# Patient Record
Sex: Male | Born: 1945
Health system: Southern US, Community
[De-identification: ages and names within clinical notes are randomized; demographics above are authoritative.]

## PROBLEM LIST (undated history)

## (undated) DIAGNOSIS — E785 Hyperlipidemia, unspecified: Secondary | ICD-10-CM

## (undated) DIAGNOSIS — E119 Type 2 diabetes mellitus without complications: Secondary | ICD-10-CM

## (undated) DIAGNOSIS — N529 Male erectile dysfunction, unspecified: Secondary | ICD-10-CM

## (undated) DIAGNOSIS — F329 Major depressive disorder, single episode, unspecified: Secondary | ICD-10-CM

## (undated) DIAGNOSIS — Z8673 Personal history of transient ischemic attack (TIA), and cerebral infarction without residual deficits: Secondary | ICD-10-CM

## (undated) DIAGNOSIS — I639 Cerebral infarction, unspecified: Secondary | ICD-10-CM

## (undated) DIAGNOSIS — F32A Depression, unspecified: Secondary | ICD-10-CM

## (undated) HISTORY — DX: Depression, unspecified: F32.A

## (undated) HISTORY — DX: Major depressive disorder, single episode, unspecified: F32.9

## (undated) HISTORY — DX: Personal history of transient ischemic attack (TIA), and cerebral infarction without residual deficits: Z86.73

## (undated) HISTORY — DX: Type 2 diabetes mellitus without complications: E11.9

## (undated) HISTORY — DX: Male erectile dysfunction, unspecified: N52.9

## (undated) HISTORY — DX: Hyperlipidemia, unspecified: E78.5

---

## 1989-04-26 HISTORY — PX: COLONOSCOPY: SHX174

## 2004-07-15 ENCOUNTER — Encounter: Payer: Self-pay | Admitting: Emergency Medicine

## 2004-07-16 ENCOUNTER — Ambulatory Visit: Payer: Self-pay | Admitting: Physical Medicine & Rehabilitation

## 2004-07-16 ENCOUNTER — Inpatient Hospital Stay (HOSPITAL_COMMUNITY): Admission: EM | Admit: 2004-07-16 | Discharge: 2004-07-19 | Payer: Self-pay | Admitting: Neurology

## 2004-07-16 ENCOUNTER — Encounter (INDEPENDENT_AMBULATORY_CARE_PROVIDER_SITE_OTHER): Payer: Self-pay | Admitting: Cardiology

## 2004-07-31 ENCOUNTER — Encounter: Admission: RE | Admit: 2004-07-31 | Discharge: 2004-10-29 | Payer: Self-pay | Admitting: Neurology

## 2004-08-01 ENCOUNTER — Ambulatory Visit: Payer: Self-pay | Admitting: Internal Medicine

## 2004-08-15 ENCOUNTER — Encounter: Admission: RE | Admit: 2004-08-15 | Discharge: 2004-11-13 | Payer: Self-pay | Admitting: Internal Medicine

## 2004-11-08 ENCOUNTER — Ambulatory Visit: Payer: Self-pay | Admitting: Internal Medicine

## 2005-03-11 ENCOUNTER — Ambulatory Visit: Payer: Self-pay | Admitting: Internal Medicine

## 2005-05-13 ENCOUNTER — Ambulatory Visit: Payer: Self-pay | Admitting: Internal Medicine

## 2005-08-14 ENCOUNTER — Ambulatory Visit: Payer: Self-pay | Admitting: Internal Medicine

## 2005-12-09 ENCOUNTER — Ambulatory Visit: Payer: Self-pay | Admitting: Endocrinology

## 2005-12-16 ENCOUNTER — Ambulatory Visit: Payer: Self-pay | Admitting: Internal Medicine

## 2005-12-23 ENCOUNTER — Ambulatory Visit: Payer: Self-pay | Admitting: Internal Medicine

## 2006-03-06 ENCOUNTER — Ambulatory Visit: Payer: Self-pay | Admitting: Internal Medicine

## 2006-03-14 ENCOUNTER — Ambulatory Visit (HOSPITAL_COMMUNITY): Admission: RE | Admit: 2006-03-14 | Discharge: 2006-03-14 | Payer: Self-pay | Admitting: Neurology

## 2006-03-14 ENCOUNTER — Encounter (INDEPENDENT_AMBULATORY_CARE_PROVIDER_SITE_OTHER): Payer: Self-pay | Admitting: Neurology

## 2006-04-04 ENCOUNTER — Ambulatory Visit: Payer: Self-pay | Admitting: Internal Medicine

## 2006-07-07 ENCOUNTER — Ambulatory Visit: Payer: Self-pay | Admitting: Internal Medicine

## 2006-08-28 ENCOUNTER — Ambulatory Visit: Payer: Self-pay | Admitting: Internal Medicine

## 2006-10-05 ENCOUNTER — Emergency Department (HOSPITAL_COMMUNITY): Admission: EM | Admit: 2006-10-05 | Discharge: 2006-10-05 | Payer: Self-pay | Admitting: Emergency Medicine

## 2006-10-15 ENCOUNTER — Ambulatory Visit: Payer: Self-pay | Admitting: Internal Medicine

## 2006-12-16 ENCOUNTER — Ambulatory Visit: Payer: Self-pay | Admitting: Internal Medicine

## 2007-01-27 ENCOUNTER — Ambulatory Visit: Payer: Self-pay | Admitting: Internal Medicine

## 2007-03-18 DIAGNOSIS — Z8679 Personal history of other diseases of the circulatory system: Secondary | ICD-10-CM

## 2007-03-18 DIAGNOSIS — K279 Peptic ulcer, site unspecified, unspecified as acute or chronic, without hemorrhage or perforation: Secondary | ICD-10-CM | POA: Insufficient documentation

## 2007-05-05 ENCOUNTER — Ambulatory Visit: Payer: Self-pay | Admitting: Internal Medicine

## 2007-05-20 ENCOUNTER — Ambulatory Visit: Payer: Self-pay | Admitting: Internal Medicine

## 2007-05-20 LAB — CONVERTED CEMR LAB
ALT: 28 units/L (ref 0–53)
AST: 29 units/L (ref 0–37)
BUN: 6 mg/dL (ref 6–23)
CO2: 31 meq/L (ref 19–32)
Calcium: 9.3 mg/dL (ref 8.4–10.5)
Chloride: 108 meq/L (ref 96–112)
Cholesterol: 134 mg/dL (ref 0–200)
Creatinine, Ser: 1.2 mg/dL (ref 0.4–1.5)
Creatinine,U: 342.1 mg/dL
GFR calc Af Amer: 79 mL/min
GFR calc non Af Amer: 65 mL/min
Glucose, Bld: 115 mg/dL — ABNORMAL HIGH (ref 70–99)
HDL: 33 mg/dL — ABNORMAL LOW (ref >39.0)
Hgb A1c MFr Bld: 6.4 % — ABNORMAL HIGH (ref 4.6–6.0)
LDL Cholesterol: 83 mg/dL (ref 0–99)
Microalb Creat Ratio: 8.5 mg/g (ref 0.0–30.0)
Microalb, Ur: 2.9 mg/dL — ABNORMAL HIGH (ref 0.0–1.9)
Potassium: 4.2 meq/L (ref 3.5–5.1)
Sodium: 145 meq/L (ref 135–145)
TSH: 0.34 microintl units/mL — ABNORMAL LOW (ref 0.35–5.50)
Total CHOL/HDL Ratio: 4.1
Triglycerides: 89 mg/dL (ref 0–149)
VLDL: 18 mg/dL (ref 0–40)

## 2007-07-10 ENCOUNTER — Encounter: Payer: Self-pay | Admitting: *Deleted

## 2007-07-10 DIAGNOSIS — Z8711 Personal history of peptic ulcer disease: Secondary | ICD-10-CM | POA: Insufficient documentation

## 2007-07-10 DIAGNOSIS — F528 Other sexual dysfunction not due to a substance or known physiological condition: Secondary | ICD-10-CM | POA: Insufficient documentation

## 2007-07-10 DIAGNOSIS — E785 Hyperlipidemia, unspecified: Secondary | ICD-10-CM

## 2007-07-10 DIAGNOSIS — I10 Essential (primary) hypertension: Secondary | ICD-10-CM

## 2007-07-10 DIAGNOSIS — F329 Major depressive disorder, single episode, unspecified: Secondary | ICD-10-CM | POA: Insufficient documentation

## 2007-08-17 ENCOUNTER — Encounter: Payer: Self-pay | Admitting: Internal Medicine

## 2007-08-26 ENCOUNTER — Ambulatory Visit: Payer: Self-pay | Admitting: Internal Medicine

## 2007-08-26 DIAGNOSIS — R946 Abnormal results of thyroid function studies: Secondary | ICD-10-CM | POA: Insufficient documentation

## 2007-09-04 LAB — CONVERTED CEMR LAB
Free T4: 0.8 ng/dL (ref 0.6–1.6)
T3, Free: 2.8 pg/mL (ref 2.3–4.2)
TSH: 0.57 microintl units/mL (ref 0.35–5.50)

## 2007-11-13 ENCOUNTER — Ambulatory Visit: Payer: Self-pay | Admitting: Internal Medicine

## 2007-11-13 LAB — CONVERTED CEMR LAB
ALT: 19 units/L (ref 0–53)
AST: 24 units/L (ref 0–37)
Albumin: 3.7 g/dL (ref 3.5–5.2)
Alkaline Phosphatase: 78 units/L (ref 39–117)
BUN: 10 mg/dL (ref 6–23)
Bilirubin Urine: NEGATIVE
Bilirubin, Direct: 0.1 mg/dL (ref 0.0–0.3)
CO2: 27 meq/L (ref 19–32)
Calcium: 9.3 mg/dL (ref 8.4–10.5)
Chloride: 106 meq/L (ref 96–112)
Cholesterol: 156 mg/dL (ref 0–200)
Creatinine, Ser: 1.1 mg/dL (ref 0.4–1.5)
Crystals: NEGATIVE
GFR calc Af Amer: 87 mL/min
GFR calc non Af Amer: 72 mL/min
Glucose, Bld: 121 mg/dL — ABNORMAL HIGH (ref 70–99)
HDL: 36.5 mg/dL — ABNORMAL LOW (ref >39.0)
Hgb A1c MFr Bld: 6.1 % — ABNORMAL HIGH (ref 4.6–6.0)
Ketones, ur: NEGATIVE mg/dL
LDL Cholesterol: 106 mg/dL — ABNORMAL HIGH (ref 0–99)
Leukocytes, UA: NEGATIVE
Nitrite: NEGATIVE
PSA: 2.2 ng/mL (ref 0.10–4.00)
Potassium: 3.7 meq/L (ref 3.5–5.1)
Sodium: 140 meq/L (ref 135–145)
Specific Gravity, Urine: >=1.03 (ref 1.000–1.03)
Total Bilirubin: 0.4 mg/dL (ref 0.3–1.2)
Total CHOL/HDL Ratio: 4.3
Total Protein: 7.5 g/dL (ref 6.0–8.3)
Triglycerides: 70 mg/dL (ref 0–149)
Urine Glucose: NEGATIVE mg/dL
Urobilinogen, UA: 0.2 (ref 0.0–1.0)
VLDL: 14 mg/dL (ref 0–40)
pH: 5.5 (ref 5.0–8.0)

## 2007-11-17 ENCOUNTER — Encounter: Payer: Self-pay | Admitting: Internal Medicine

## 2007-11-17 LAB — CONVERTED CEMR LAB
ALT: 19 units/L (ref 0–53)
AST: 24 units/L (ref 0–37)
Albumin: 3.7 g/dL (ref 3.5–5.2)
Alkaline Phosphatase: 78 units/L (ref 39–117)
BUN: 10 mg/dL (ref 6–23)
Bilirubin Urine: NEGATIVE
Bilirubin, Direct: 0.1 mg/dL (ref 0.0–0.3)
CO2: 27 meq/L (ref 19–32)
CRP, High Sensitivity: 3.4 — ABNORMAL HIGH
Calcium: 9.3 mg/dL (ref 8.4–10.5)
Chloride: 106 meq/L (ref 96–112)
Cholesterol: 156 mg/dL (ref 0–200)
Creatinine, Ser: 1.1 mg/dL (ref 0.4–1.5)
Crystals: NEGATIVE
GFR calc Af Amer: 87 mL/min
GFR calc non Af Amer: 72 mL/min
Glucose, Bld: 121 mg/dL — ABNORMAL HIGH (ref 70–99)
HDL: 36.5 mg/dL — ABNORMAL LOW (ref >39.0)
Hgb A1c MFr Bld: 6.1 % — ABNORMAL HIGH (ref 4.6–6.0)
Ketones, ur: NEGATIVE mg/dL
LDL Cholesterol: 106 mg/dL — ABNORMAL HIGH (ref 0–99)
Leukocytes, UA: NEGATIVE
Nitrite: NEGATIVE
PSA: 2.2 ng/mL (ref 0.10–4.00)
Potassium: 3.7 meq/L (ref 3.5–5.1)
Sodium: 140 meq/L (ref 135–145)
Specific Gravity, Urine: >=1.03 (ref 1.000–1.03)
Total Bilirubin: 0.4 mg/dL (ref 0.3–1.2)
Total CHOL/HDL Ratio: 4.3
Total Protein: 7.5 g/dL (ref 6.0–8.3)
Triglycerides: 70 mg/dL (ref 0–149)
Urine Glucose: NEGATIVE mg/dL
Urobilinogen, UA: 0.2 (ref 0.0–1.0)
VLDL: 14 mg/dL (ref 0–40)
pH: 5.5 (ref 5.0–8.0)

## 2007-11-18 ENCOUNTER — Ambulatory Visit: Payer: Self-pay | Admitting: Internal Medicine

## 2007-11-18 DIAGNOSIS — F17200 Nicotine dependence, unspecified, uncomplicated: Secondary | ICD-10-CM | POA: Insufficient documentation

## 2008-01-12 ENCOUNTER — Encounter: Payer: Self-pay | Admitting: Internal Medicine

## 2008-01-22 ENCOUNTER — Ambulatory Visit: Payer: Self-pay | Admitting: Internal Medicine

## 2008-01-22 LAB — CONVERTED CEMR LAB
ALT: 30 units/L (ref 0–53)
AST: 32 units/L (ref 0–37)
Cholesterol: 125 mg/dL (ref 0–200)
HDL: 28.6 mg/dL — ABNORMAL LOW (ref >39.0)
LDL Cholesterol: 82 mg/dL (ref 0–99)
Total CHOL/HDL Ratio: 4.4
Triglycerides: 70 mg/dL (ref 0–149)
VLDL: 14 mg/dL (ref 0–40)

## 2008-02-09 ENCOUNTER — Ambulatory Visit: Payer: Self-pay | Admitting: Internal Medicine

## 2008-05-20 ENCOUNTER — Encounter: Payer: Self-pay | Admitting: Internal Medicine

## 2008-06-10 ENCOUNTER — Ambulatory Visit: Payer: Self-pay | Admitting: Internal Medicine

## 2008-06-10 DIAGNOSIS — R413 Other amnesia: Secondary | ICD-10-CM

## 2008-06-10 LAB — CONVERTED CEMR LAB
Folate: 5.9 ng/mL
TSH: 0.87 microintl units/mL (ref 0.35–5.50)
Vitamin B-12: 577 pg/mL (ref 211–911)

## 2008-06-13 ENCOUNTER — Encounter: Payer: Self-pay | Admitting: Internal Medicine

## 2008-07-22 ENCOUNTER — Ambulatory Visit: Payer: Self-pay | Admitting: Internal Medicine

## 2008-07-22 DIAGNOSIS — R1013 Epigastric pain: Secondary | ICD-10-CM | POA: Insufficient documentation

## 2008-12-08 ENCOUNTER — Ambulatory Visit: Payer: Self-pay | Admitting: Internal Medicine

## 2008-12-08 ENCOUNTER — Ambulatory Visit (HOSPITAL_BASED_OUTPATIENT_CLINIC_OR_DEPARTMENT_OTHER): Admission: RE | Admit: 2008-12-08 | Discharge: 2008-12-08 | Payer: Self-pay | Admitting: Internal Medicine

## 2008-12-08 ENCOUNTER — Ambulatory Visit: Payer: Self-pay | Admitting: Diagnostic Radiology

## 2008-12-08 DIAGNOSIS — R634 Abnormal weight loss: Secondary | ICD-10-CM

## 2008-12-08 LAB — CONVERTED CEMR LAB
ALT: 14 units/L (ref 0–53)
AST: 23 units/L (ref 0–37)
BUN: 9 mg/dL (ref 6–23)
CO2: 26 meq/L (ref 19–32)
Calcium: 9.5 mg/dL (ref 8.4–10.5)
Chloride: 105 meq/L (ref 96–112)
Cholesterol: 178 mg/dL (ref 0–200)
Creatinine, Ser: 1.1 mg/dL (ref 0.4–1.5)
GFR calc non Af Amer: 86.79 mL/min (ref >60)
Glucose, Bld: 85 mg/dL (ref 70–99)
HDL: 40.3 mg/dL (ref >39.00)
Hgb A1c MFr Bld: 6 % (ref 4.6–6.5)
LDL Cholesterol: 123 mg/dL — ABNORMAL HIGH (ref 0–99)
Potassium: 3.7 meq/L (ref 3.5–5.1)
Sodium: 140 meq/L (ref 135–145)
Total CHOL/HDL Ratio: 4
Triglycerides: 74 mg/dL (ref 0.0–149.0)
VLDL: 14.8 mg/dL (ref 0.0–40.0)

## 2008-12-08 LAB — HM DIABETES FOOT EXAM

## 2008-12-12 ENCOUNTER — Encounter: Payer: Self-pay | Admitting: Internal Medicine

## 2009-04-10 ENCOUNTER — Ambulatory Visit: Payer: Self-pay | Admitting: Internal Medicine

## 2009-08-07 ENCOUNTER — Ambulatory Visit: Payer: Self-pay | Admitting: Internal Medicine

## 2009-08-07 LAB — CONVERTED CEMR LAB
ALT: 11 units/L (ref 0–53)
AST: 11 units/L (ref 0–37)
Albumin: 4.3 g/dL (ref 3.5–5.2)
Alkaline Phosphatase: 70 units/L (ref 39–117)
BUN: 11 mg/dL (ref 6–23)
Bilirubin, Direct: 0.1 mg/dL (ref 0.0–0.3)
CO2: 24 meq/L (ref 19–32)
Calcium: 9.6 mg/dL (ref 8.4–10.5)
Chloride: 107 meq/L (ref 96–112)
Cholesterol: 195 mg/dL (ref 0–200)
Creatinine, Ser: 1.17 mg/dL (ref 0.40–1.50)
Creatinine, Urine: 128.7 mg/dL
Glucose, Bld: 99 mg/dL (ref 70–99)
HDL: 42 mg/dL (ref >39)
Hgb A1c MFr Bld: 5.8 % (ref 4.6–6.1)
Indirect Bilirubin: 0.3 mg/dL (ref 0.0–0.9)
LDL Cholesterol: 136 mg/dL — ABNORMAL HIGH (ref 0–99)
Microalb Creat Ratio: 39.3 mg/g — ABNORMAL HIGH (ref 0.0–30.0)
Microalb, Ur: 5.06 mg/dL — ABNORMAL HIGH (ref 0.00–1.89)
Potassium: 4.3 meq/L (ref 3.5–5.3)
Sodium: 143 meq/L (ref 135–145)
TSH: 0.753 microintl units/mL (ref 0.350–4.500)
Total Bilirubin: 0.4 mg/dL (ref 0.3–1.2)
Total CHOL/HDL Ratio: 4.6
Total Protein: 7.5 g/dL (ref 6.0–8.3)
Triglycerides: 84 mg/dL (ref <150)
VLDL: 17 mg/dL (ref 0–40)

## 2009-08-08 ENCOUNTER — Telehealth: Payer: Self-pay | Admitting: Internal Medicine

## 2009-08-08 ENCOUNTER — Encounter: Payer: Self-pay | Admitting: Internal Medicine

## 2009-10-09 ENCOUNTER — Ambulatory Visit: Payer: Self-pay | Admitting: Internal Medicine

## 2009-10-09 DIAGNOSIS — H1589 Other disorders of sclera: Secondary | ICD-10-CM | POA: Insufficient documentation

## 2010-04-09 ENCOUNTER — Ambulatory Visit: Payer: Self-pay | Admitting: Internal Medicine

## 2010-04-09 DIAGNOSIS — E119 Type 2 diabetes mellitus without complications: Secondary | ICD-10-CM | POA: Insufficient documentation

## 2010-04-09 LAB — CONVERTED CEMR LAB
BUN: 11 mg/dL (ref 6–23)
CO2: 25 meq/L (ref 19–32)
Calcium: 9.7 mg/dL (ref 8.4–10.5)
Chloride: 107 meq/L (ref 96–112)
Creatinine, Ser: 0.97 mg/dL (ref 0.40–1.50)
Glucose, Bld: 95 mg/dL (ref 70–99)
Hgb A1c MFr Bld: 5.9 % — ABNORMAL HIGH (ref <5.7)
Potassium: 4.1 meq/L (ref 3.5–5.3)
Sodium: 142 meq/L (ref 135–145)

## 2010-04-10 ENCOUNTER — Encounter: Payer: Self-pay | Admitting: Internal Medicine

## 2010-04-18 ENCOUNTER — Telehealth (INDEPENDENT_AMBULATORY_CARE_PROVIDER_SITE_OTHER): Payer: Self-pay | Admitting: *Deleted

## 2010-07-10 ENCOUNTER — Telehealth: Payer: Self-pay | Admitting: Internal Medicine

## 2010-08-14 ENCOUNTER — Encounter: Payer: Self-pay | Admitting: Internal Medicine

## 2010-08-14 ENCOUNTER — Ambulatory Visit: Payer: Self-pay | Admitting: Internal Medicine

## 2010-08-14 LAB — CONVERTED CEMR LAB
ALT: 10 units/L (ref 0–53)
AST: 15 units/L (ref 0–37)
Albumin: 4.3 g/dL (ref 3.5–5.2)
Alkaline Phosphatase: 79 units/L (ref 39–117)
BUN: 9 mg/dL (ref 6–23)
Bilirubin, Direct: 0.1 mg/dL (ref 0.0–0.3)
CO2: 23 meq/L (ref 19–32)
Calcium: 9.2 mg/dL (ref 8.4–10.5)
Chloride: 106 meq/L (ref 96–112)
Cholesterol: 203 mg/dL — ABNORMAL HIGH (ref 0–200)
Creatinine, Ser: 1 mg/dL (ref 0.40–1.50)
Glucose, Bld: 96 mg/dL (ref 70–99)
HDL: 42 mg/dL (ref >39)
Hgb A1c MFr Bld: 6.1 % — ABNORMAL HIGH (ref <5.7)
Indirect Bilirubin: 0.3 mg/dL (ref 0.0–0.9)
LDL Cholesterol: 147 mg/dL — ABNORMAL HIGH (ref 0–99)
Potassium: 4 meq/L (ref 3.5–5.3)
Sodium: 140 meq/L (ref 135–145)
Total Bilirubin: 0.4 mg/dL (ref 0.3–1.2)
Total CHOL/HDL Ratio: 4.8
Total Protein: 7.7 g/dL (ref 6.0–8.3)
Triglycerides: 68 mg/dL (ref <150)
VLDL: 14 mg/dL (ref 0–40)

## 2010-08-15 ENCOUNTER — Ambulatory Visit: Payer: Self-pay

## 2010-08-15 ENCOUNTER — Encounter: Payer: Self-pay | Admitting: Internal Medicine

## 2010-08-16 ENCOUNTER — Telehealth: Payer: Self-pay | Admitting: Internal Medicine

## 2010-08-17 ENCOUNTER — Encounter: Payer: Self-pay | Admitting: Internal Medicine

## 2010-08-17 ENCOUNTER — Encounter (INDEPENDENT_AMBULATORY_CARE_PROVIDER_SITE_OTHER): Payer: Self-pay | Admitting: *Deleted

## 2010-09-18 ENCOUNTER — Ambulatory Visit: Admit: 2010-09-18 | Payer: Self-pay | Admitting: Internal Medicine

## 2010-09-25 NOTE — Progress Notes (Signed)
  Phone Note Other Incoming   Request: Send information Summary of Call: Request for records received from [facility]. Request forwarded to [facility].     

## 2010-09-25 NOTE — Letter (Signed)
   [place] [facility] [address] [place], [place]  [address]  [place] Phone: [phone]      April 10, 2010   Sean Myers [address] [place], [place] [address]  RE:  LAB RESULTS  Dear  [name],  The following is an interpretation of your most recent lab tests.  Please take note of any instructions provided or changes to medications that have resulted from your lab work.  ELECTROLYTES:  Good - no changes needed  KIDNEY FUNCTION TESTS:  Good - no changes needed    DIABETIC STUDIES:  Good - no changes needed Blood Glucose: 95   HgbA1C: 5.9   Microalbumin/Creatinine Ratio: 39.3          Sincerely Yours,    Dr. Thomos Lemons

## 2010-09-25 NOTE — Assessment & Plan Note (Signed)
Summary: 6 month follow up/mhf   Vital Signs:  Patient profile:   65 year old male Weight:      182.25 pounds BMI:     25.87 O2 Sat:      95 % on Room air Temp:     98.4 degrees F oral Pulse rate:   75 / minute Pulse rhythm:   regular Resp:     16 per minute BP sitting:   130 / 80  (right arm) Cuff size:   large  Vitals Entered By: Glendell Docker CMA (April 09, 2010 10:18 AM)  O2 Flow:  Room air CC: Rm  3- 6 Month Follow up  Is Patient Diabetic? Yes Did you bring your meter with you today? No Pain Assessment Patient in pain? no       Does patient need assistance? Functional Status Self care Ambulation Normal Comments low blood sugar 70 , high 110  avg 80 ,   Primary Care Provider:  DThomos Lemons DO  CC:  Rm  3- 6 Month Follow up .  History of Present Illness:  Hypertension Follow-Up      This is a 65 year old man who presents for Hypertension follow-up.  The patient denies lightheadedness and headaches.  The patient denies the following associated symptoms: chest pain.  Compliance with medications (by patient report) has been near 100%.    memory loss - better with exelon patch.  stopped using patch due to minor skin irritation  Preventive Screening-Counseling & Management  Alcohol-Tobacco     Smoking Status: current     Smoking Cessation Counseling: yes  Allergies: 1)  ! Asa 2)  ! Ipol (Poliovirus Vaccine Inactivated) 3)  ! Pcn  Past History:  Past Medical History: DUODENAL ULCER, HX OF (ICD-V12.71) ERECTILE DYSFUNCTION (ICD-302.72) DEPRESSION (ICD-311)    HYPERLIPIDEMIA (ICD-272.4)  HYPERTENSION (ICD-401.9)  CEREBROVASCULAR ACCIDENT, HX OF (ICD-V12.50) PEPTIC ULCER DISEASE (ICD-533.90) DIABETES MELLITUS, TYPE II (ICD-250.00)      Social History: Retired Married Current Smoker  Alcohol use-no      son - Producer, television/film/video living in [place]  Physical Exam  General:  alert, well-developed, and well-nourished.   Neck:  No deformities,  masses, or tenderness noted. Lungs:  normal respiratory effort and normal breath sounds.   Heart:  normal rate, regular rhythm, and no gallop.   Extremities:  No lower extremity edema    Impression & Recommendations:  Problem # 1:  HYPERTENSION (ICD-401.9) Assessment Unchanged  His updated medication list for this problem includes:    Lisinopril-hydrochlorothiazide 20-12.5 Mg Tabs (Lisinopril-hydrochlorothiazide) .Marland Kitchen... Take 1 tablet by mouth once a day    Amlodipine Besylate 5 Mg Tabs (Amlodipine besylate) .Marland Kitchen... Take 1 tablet by mouth once a day  BP today: 130/80 Prior BP: 114/70 (10/09/2009)  Labs Reviewed: K+: 4.3 (08/07/2009) Creat: : 1.17 (08/07/2009)   Chol: 195 (08/07/2009)   HDL: 42 (08/07/2009)   LDL: 136 (08/07/2009)   TG: 84 (08/07/2009)  Orders: T-Basic Metabolic Panel [id number])  Problem # 2:  MEMORY LOSS (ICD-780.93) Prev TSH, B12, RPR normal continue exelon patch pt having some minor skin irritation - use hydrocortisone cream as needed unable to tolerate aricept due to diarrhea  Problem # 3:  DIABETES MELLITUS, TYPE II, BORDERLINE (ICD-790.29) Assessment: Unchanged A1c 5.9  Labs Reviewed: Creat: 1.17 (08/07/2009)     Complete Medication List: 1)  Plavix 75 Mg Tabs (Clopidogrel bisulfate) .... One by mouth once daily 2)  Lipitor 40 Mg Tabs (Atorvastatin calcium) .... One  by mouth once daily 3)  Lisinopril-hydrochlorothiazide 20-12.5 Mg Tabs (Lisinopril-hydrochlorothiazide) .... Take 1 tablet by mouth once a day 4)  Amlodipine Besylate 5 Mg Tabs (Amlodipine besylate) .... Take 1 tablet by mouth once a day 5)  Viagra 100 Mg Tabs (Sildenafil citrate) .... Take as directed and as needed 6)  Sertraline Hcl 25 Mg Tabs (Sertraline hcl) .Marland Kitchen.. 1 by mouth qd 7)  Exelon 9.5 Mg/24hr Pt24 (Rivastigmine) .... Apply new patch daily 8)  Accu-chek Aviva Strp (Glucose blood) .... Use daily as directed 9)  Accu-chek Multiclix Lancets Misc (Lancets) .... Use as  directed  Other Orders: T- Hemoglobin A1C ([id number])  Patient Instructions: 1)  Please schedule a follow-up appointment in 6 months. Prescriptions: EXELON 9.5 MG/24HR PT24 (RIVASTIGMINE) apply new patch daily  #90 x 1   Entered and Authorized by:   D. Thomos Lemons DO   Signed by:   D. Thomos Lemons DO on 04/09/2010   Method used:   Electronically to        [id number] (retail)       [address]       [place], [place]  [address]       Ph: [phone] or [phone]       Fax: [phone]   RxID:   [id number] SERTRALINE HCL 25 MG TABS (SERTRALINE HCL) 1 by mouth qd  #90 x 3   Entered and Authorized by:   D. Thomos Lemons DO   Signed by:   D. Thomos Lemons DO on 04/09/2010   Method used:   Electronically to        [id number]* (retail)       [address]       [place], [place]  [address]       Ph: [phone] or [phone]       Fax: [phone]   RxID:   [id number] PLAVIX 75 MG  TABS (CLOPIDOGREL BISULFATE) one by mouth once daily  #90 x 3   Entered and Authorized by:   D. Thomos Lemons DO   Signed by:   D. Thomos Lemons DO on 04/09/2010   Method used:   Electronically to        [id number] (retail)       [address]       [place], [place]  [address]       Ph: [phone] or [phone]       Fax: [phone]   RxID:   [id number] LIPITOR 40 MG TABS (ATORVASTATIN CALCIUM) one by mouth once daily  #90 x 3   Entered and Authorized by:   D. Thomos Lemons DO   Signed by:   D. Thomos Lemons DO on 04/09/2010   Method used:   Electronically to        [id number] (retail)       [address]       [place], [place]  [address]       Ph: [phone] or [phone]       Fax: [phone]   RxID:   [id number] LISINOPRIL-HYDROCHLOROTHIAZIDE 20-12.5 MG  TABS (LISINOPRIL-HYDROCHLOROTHIAZIDE) Take 1 tablet by mouth once a  day  #90 x 3   Entered and Authorized by:  Dondra Spry DO   Signed by:   D. Thomos Lemons DO on 04/09/2010   Method used:   Electronically to        [id number] (retail)       [address]       [place], [place]  [address]       Ph: [phone] or [phone]       Fax: [phone]   RxID:   [id number] AMLODIPINE BESYLATE 5 MG  TABS (AMLODIPINE BESYLATE) Take 1 tablet by mouth once a day  #90 x 3   Entered and Authorized by:   D. Thomos Lemons DO   Signed by:   D. Thomos Lemons DO on 04/09/2010   Method used:   Electronically to        [id number] (retail)       [address]       [place], [place]  [address]       Ph: [phone] or [phone]       Fax: [phone]   RxID:   [id number]   Current Allergies (reviewed today): ! ASA ! IPOL (POLIOVIRUS VACCINE INACTIVATED) ! PCN

## 2010-09-25 NOTE — Progress Notes (Signed)
Summary: Medication Change  Phone Note Other Incoming   Summary of Call: pt with chronic dry cough change ACE to ARB FOV needed in 1 month - plz call pt to schedule OV  also make sure pt stops lisinopril Initial call taken by: D. Thomos Lemons DO,  July 10, 2010 8:48 AM  Follow-up for Phone Call        call placed to patient at [phone], he has been advised per Dr Artist Pais instructions. He has scheduled follow up for Tuesday 12/20 @ 10:15 am with Dr Artist Pais Follow-up by: Glendell Docker CMA,  July 11, 2010 9:27 AM    New/Updated Medications: LOSARTAN POTASSIUM-HCTZ 50-12.5 MG TABS (LOSARTAN POTASSIUM-HCTZ) one by mouth once daily Prescriptions: LOSARTAN POTASSIUM-HCTZ 50-12.5 MG TABS (LOSARTAN POTASSIUM-HCTZ) one by mouth once daily  #30 x 2   Entered and Authorized by:   D. Thomos Lemons DO   Signed by:   D. Thomos Lemons DO on 07/10/2010   Method used:   Electronically to        [id number] (retail)       [address]       [place], [place]  [address]       Ph: [phone] or [phone]       Fax: [phone]   RxID:   [id number]

## 2010-09-25 NOTE — Assessment & Plan Note (Signed)
Summary: 2 MONTH FOLLOW UP/MHF   Vital Signs:  Patient profile:   65 year old male Weight:      175.50 pounds BMI:     24.92 O2 Sat:      99 % on Room air Temp:     97.6 degrees F rectal Pulse rate:   64 / minute Pulse rhythm:   regular Resp:     18 per minute BP sitting:   114 / 70  (left arm) Cuff size:   large  Vitals Entered By: Glendell Docker CMA (October 09, 2009 11:25 AM)  O2 Flow:  Room air  Primary Care Provider:  D. Thomos Lemons DO  CC:  2 Month Follow up .  History of Present Illness: 2 Month Follow up   65 y/o AA male for f/u pt and family noticed improvement in memory since using exelon patch they have also fill out pill box which helps with compliance.  left eye - noticed cyst on sclera.  no symptoms  Allergies: 1)  ! Asa 2)  ! Ipol (Poliovirus Vaccine Inactivated) 3)  ! Pcn  Past History:  Past Medical History: DUODENAL ULCER, HX OF (ICD-V12.71) ERECTILE DYSFUNCTION (ICD-302.72) DEPRESSION (ICD-311)    HYPERLIPIDEMIA (ICD-272.4)  HYPERTENSION (ICD-401.9) CEREBROVASCULAR ACCIDENT, HX OF (ICD-V12.50) PEPTIC ULCER DISEASE (ICD-533.90) DIABETES MELLITUS, TYPE II (ICD-250.00)      Social History: Retired Married Current Smoker  Alcohol use-no       Physical Exam  General:  alert, well-developed, and well-nourished.   Head:  normocephalic and atraumatic.   Eyes:  2mm jelly like sac on left eye Lungs:  normal respiratory effort and normal breath sounds.   Heart:  normal rate, regular rhythm, and no gallop.   Extremities:  No lower extremity edema  Psych:  normally interactive, good eye contact, not anxious appearing, and not depressed appearing.     Impression & Recommendations:  Problem # 1:  MEMORY LOSS (ICD-780.93) Assessment Improved mild improvement with namenda.  pill box helping with compliance.  Problem # 2:  HYPERTENSION (ICD-401.9) Assessment: Improved Improved compliance.  Maintain current medication regimen.  His  updated medication list for this problem includes:    Lisinopril-hydrochlorothiazide 20-12.5 Mg Tabs (Lisinopril-hydrochlorothiazide) .Marland Kitchen... Take 1 tablet by mouth once a day    Amlodipine Besylate 5 Mg Tabs (Amlodipine besylate) .Marland Kitchen... Take 1 tablet by mouth once a day  BP today: 114/70 Prior BP: 160/70 (08/07/2009)  Labs Reviewed: K+: 4.3 (08/07/2009) Creat: : 1.17 (08/07/2009)   Chol: 195 (08/07/2009)   HDL: 42 (08/07/2009)   LDL: 136 (08/07/2009)   TG: 84 (08/07/2009)  Problem # 3:  OTHER SCLERAL DISORDER (ICD-379.19) small jelly like sac on sclera.  he has appt with eye doctor.    Complete Medication List: 1)  Plavix 75 Mg Tabs (Clopidogrel bisulfate) .... One by mouth once daily 2)  Lipitor 40 Mg Tabs (Atorvastatin calcium) .... One by mouth once daily 3)  Lisinopril-hydrochlorothiazide 20-12.5 Mg Tabs (Lisinopril-hydrochlorothiazide) .... Take 1 tablet by mouth once a day 4)  Amlodipine Besylate 5 Mg Tabs (Amlodipine besylate) .... Take 1 tablet by mouth once a day 5)  Viagra 100 Mg Tabs (Sildenafil citrate) .... Take as directed and as needed 6)  Sertraline Hcl 25 Mg Tabs (Sertraline hcl) .Marland Kitchen.. 1 by mouth qd 7)  Exelon 9.5 Mg/24hr Pt24 (Rivastigmine) .... Apply new patch daily 8)  Accu-chek Aviva Strp (Glucose blood) .... Use daily as directed 9)  Accu-chek Multiclix Lancets Misc (Lancets) .... Use as directed  Patient Instructions: 1)  Please schedule a follow-up appointment in 6 months. 2)  BMP prior to visit, ICD-9:  401.9 3)  Lipid Panel prior to visit, ICD-9: 272.4 4)  HbgA1C prior to visit, ICD-9: 790.29 5)  Please return for lab work one (1) week before your next appointment.  Prescriptions: PLAVIX 75 MG  TABS (CLOPIDOGREL BISULFATE) one by mouth once daily  #90 x 3   Entered and Authorized by:   D. Thomos Lemons DO   Signed by:   D. Thomos Lemons DO on 10/09/2009   Method used:   Electronically to        [id number] (retail)       [address]        [place], [place]  [address]       Ph: [phone] or [phone]       Fax: [phone]   RxID:   [id number] EXELON 9.5 MG/24HR PT24 (RIVASTIGMINE) apply new patch daily  #90 x 3   Entered and Authorized by:   D. Thomos Lemons DO   Signed by:   D. Thomos Lemons DO on 10/09/2009   Method used:   Electronically to        [id number] (retail)       [address]       [place], [place]  [address]       Ph: [phone] or [phone]       Fax: [phone]   RxID:   [id number]

## 2010-09-27 NOTE — Assessment & Plan Note (Signed)
Summary: BLOOD PRESSURE F/U-DK   Vital Signs:  Patient profile:   65 year old male Height:      70.5 inches Weight:      185 pounds BMI:     26.26 O2 Sat:      100 % Temp:     97.8 degrees F oral Pulse rate:   79 / minute Resp:     18 per minute BP sitting:   140 / 90  (right arm) Cuff size:   large  Vitals Entered By: Glendell Docker CMA (August 14, 2010 10:28 AM) CC: blood Pressure Is Patient Diabetic? Yes Did you bring your meter with you today? No Pain Assessment Patient in pain? no      Comments blood pressure medication  follow up tolerating well, cough has resolved,    Primary Care Provider:  Dondra Spry DO  CC:  blood Pressure.  History of Present Illness: 65 y/o  AA male with hx of cva, htn, borderline DM II for f/u doing well unsteady when he turns,  no falls  htn - does not monitor BP at home  DM II borderline - stable  Int hx: seen at [place].  reports normal PSA  Preventive Screening-Counseling & Management  Alcohol-Tobacco     Smoking Status: current  Allergies: 1)  ! Asa 2)  ! Ipol (Poliovirus Vaccine Inactivated) 3)  ! Pcn  Past History:  Past Medical History: DUODENAL ULCER, HX OF (ICD-V12.71) ERECTILE DYSFUNCTION (ICD-302.72) DEPRESSION (ICD-311)    HYPERLIPIDEMIA (ICD-272.4)  HYPERTENSION (ICD-401.9)  CEREBROVASCULAR ACCIDENT, HX OF (ICD-V12.50) PEPTIC ULCER DISEASE (ICD-533.90) DIABETES MELLITUS, TYPE II (ICD-250.00)       Past Surgical History: colonoscopy 1990's - [facility])  Social History: Retired Married Current Smoker  Alcohol use-no      son Lavone Neri living in [place])   Review of Systems  The patient denies chest pain and dyspnea on exertion.    Physical Exam  General:  alert, well-developed, and well-nourished.   Neck:  No deformities, masses, or tenderness noted.  no carotid bruit Lungs:  normal respiratory effort and normal breath sounds.   Heart:  normal rate, regular rhythm, and no  gallop.   Abdomen:  soft, non-tender, and normal bowel sounds.   Neurologic:  cranial nerves II-XII intact, gait normal, DTRs symmetrical and normal, and finger-to-nose normal.   Psych:  normally interactive and good eye contact.     Impression & Recommendations:  Problem # 1:  HYPERTENSION (ICD-401.9) Assessment Deteriorated monitor BP at home.  increase losartan dose to 100 mg.  chronic cough resolved with stopping ACE His updated medication list for this problem includes:    Losartan Potassium-hctz 100-12.5 Mg Tabs (Losartan potassium-hctz) ..... One by mouth once daily    Amlodipine Besylate 5 Mg Tabs (Amlodipine besylate) .Marland Kitchen... Take 1 tablet by mouth once a day  Orders: T-Basic Metabolic Panel [id number])  BP today: 140/90 Prior BP: 130/80 (04/09/2010)  Labs Reviewed: K+: 4.1 (04/09/2010) Creat: : 0.97 (04/09/2010)   Chol: 195 (08/07/2009)   HDL: 42 (08/07/2009)   LDL: 136 (08/07/2009)   TG: 84 (08/07/2009)  Problem # 2:  HYPERLIPIDEMIA (ICD-272.4) Assessment: Unchanged  His updated medication list for this problem includes:    Lipitor 40 Mg Tabs (Atorvastatin calcium) ..... One by mouth once daily  Orders: T-Hepatic Function [id number]) T-Lipid Profile [id number])  Labs Reviewed: SGOT: 11 (08/07/2009)   SGPT: 11 (08/07/2009)   HDL:42 (08/07/2009), 40.30 (12/08/2008)  LDL:136 (08/07/2009), 123 (12/08/2008)  Chol:195 (  08/07/2009), 178 (12/08/2008)  Trig:84 (08/07/2009), 74.0 (12/08/2008)  Problem # 3:  DIABETES MELLITUS, TYPE II, BORDERLINE (ICD-790.29) Assessment: Unchanged  Orders: T- Hemoglobin A1C ([id number])  Labs Reviewed: Creat: 0.97 (04/09/2010)     Problem # 4:  HEALTH MAINTENANCE EXAM (ICD-V70.0)  Orders: Gastroenterology Referral (GI)  Problem # 5:  CEREBROVASCULAR ACCIDENT, HX OF (ICD-V12.50)  Orders: Doppler Referral (Doppler)  Complete Medication List: 1)  Plavix 75 Mg Tabs (Clopidogrel bisulfate) .... One by mouth once daily 2)   Lipitor 40 Mg Tabs (Atorvastatin calcium) .... One by mouth once daily 3)  Losartan Potassium-hctz 100-12.5 Mg Tabs (Losartan potassium-hctz) .... One by mouth once daily 4)  Amlodipine Besylate 5 Mg Tabs (Amlodipine besylate) .... Take 1 tablet by mouth once a day 5)  Viagra 100 Mg Tabs (Sildenafil citrate) .... Take as directed and as needed 6)  Sertraline Hcl 25 Mg Tabs (Sertraline hcl) .Marland Kitchen.. 1 by mouth qd 7)  Accu-chek Aviva Strp (Glucose blood) .... Use daily as directed 8)  Accu-chek Multiclix Lancets Misc (Lancets) .... Use as directed  Patient Instructions: 1)  Please schedule a follow-up appointment in 2 months. Prescriptions: LOSARTAN POTASSIUM-HCTZ 100-12.5 MG TABS (LOSARTAN POTASSIUM-HCTZ) one by mouth once daily  #30 x 5   Entered and Authorized by:   D. Thomos Lemons DO   Signed by:   D. Thomos Lemons DO on 08/14/2010   Method used:   Electronically to        [id number] (retail)       [address]       [place], [place]  [address]       Ph: [phone] or [phone]       Fax: [phone]   RxID:   [id number]    Orders Added: 1)  T-Basic Metabolic Panel [id number] 2)  T- Hemoglobin A1C [[id number]] 3)  T-Hepatic Function [[id number]] 4)  T-Lipid Profile [[id number]] 5)  Doppler Referral [Doppler] 6)  Gastroenterology Referral [GI] 7)  Est. Patient Level IV [[address]]     Current Allergies (reviewed today): ! ASA ! IPOL (POLIOVIRUS VACCINE INACTIVATED) ! PCN

## 2010-09-27 NOTE — Letter (Signed)
Summary: Pre Visit Letter Revised  [facility]  [address] [place], [place] [address]   Phone: [phone]  Fax: [phone]        08/17/2010 MRN: [id number]  Sean Myers [address] [place], [place]  [address]             Procedure Date:  10-02-10  8:30am            Dr Leone Payor  Direct  [facility]on   Welcome to the Gastroenterology Division at [facility].    You are scheduled to see a nurse for your pre-procedure visit on 09-18-10 at 9am on the 3rd floor at [facility], [address]  We ask that you try to arrive at our office 15 minutes prior to your appointment time to allow for check-in.  Please take a minute to review the attached form.  If you answer "Yes" to one or more of the questions on the first page, we ask that you call the person listed at your earliest opportunity.  If you answer "No" to all of the questions, please complete the rest of the form and bring it to your appointment.    Your nurse visit will consist of discussing your medical and surgical history, your immediate family medical history, and your medications.   If you are unable to list all of your medications on the form, please bring the medication bottles to your appointment and we will list them.  We will need to be aware of both prescribed and over the counter drugs.  We will need to know exact dosage information as well.    Please be prepared to read and sign documents such as consent forms, a financial agreement, and acknowledgement forms.  If necessary, and with your consent, a friend or relative is welcome to sit-in on the nurse visit with you.  Please bring your insurance card so that we may make a copy of it.  If your insurance requires a referral to see a specialist, please bring your referral form from your primary care physician.  No co-pay is required for this nurse visit.     If you cannot keep your appointment, please call [phone] to cancel or reschedule  prior to your appointment date.  This allows [place] the opportunity to schedule an appointment for another patient in need of care.    Thank you for choosing [facility] for your medical needs.  We appreciate the opportunity to care for you.  Please visit [place] at our website  to learn more about our practice.  Sincerely, The Gastroenterology Division

## 2010-09-27 NOTE — Miscellaneous (Signed)
Summary: Orders Update  Clinical Lists Changes  Orders: Added new Test order of Carotid Duplex (Carotid Duplex) - Signed 

## 2010-09-27 NOTE — Letter (Signed)
Summary: Primary Care Consult Scheduled Letter  [place] at [facility]  [address]   [place], [place] [address]   Phone: [phone]  Fax: [phone]      08/17/2010 MRN: [id number]  Sean Myers [address] [place], [place]  [address]    Dear [name],      We have scheduled an appointment for you.  At the recommendation of Dr.YOO, we have scheduled you a consult with [facility] ,DR Leone Payor  on October 02, 2010  at 8:30AM .  Their address is__[address], [place] [address] . The office phone number is [phone].  If this appointment day and time is not convenient for you, please feel free to call the office of the doctor you are being referred to at the number listed above and reschedule the appointment.     It is important for you to keep your scheduled appointments. We are here to make sure you are given good patient care.     Thank you,  Darral Dash Patient Care Coordinator [place] [facility]

## 2010-09-27 NOTE — Progress Notes (Signed)
Summary: Viagra Refill  Phone Note Refill Request Message from:  Fax from Pharmacy on August 16, 2010 12:44 PM  Refills Requested: Medication #1:  VIAGRA 100 MG  TABS Take as directed and as needed   Dosage confirmed as above?Dosage Confirmed   Brand Name Necessary? No   Supply Requested: 1 month   Last Refilled: 04/14/2010  Method Requested: Electronic Next Appointment Scheduled: 10/08/2010 Initial call taken by: Glendell Docker CMA,  August 16, 2010 12:44 PM  Follow-up for Phone Call        ok to refill x 5 Follow-up by: D. Thomos Lemons DO,  August 16, 2010 1:40 PM  Additional Follow-up for Phone Call Additional follow up Details #1::        Rx sent electronically to pharmacy Additional Follow-up by: Glendell Docker CMA,  August 16, 2010 2:05 PM    Prescriptions: VIAGRA 100 MG  TABS (SILDENAFIL CITRATE) Take as directed and as needed  #10 x 5   Entered by:   Glendell Docker CMA   Authorized by:   D. Thomos Lemons DO   Signed by:   Glendell Docker CMA on 08/16/2010   Method used:   Electronically to        [id number] (retail)       [address]       [place], [place]  [address]       Ph: [phone] or [phone]       Fax: [phone]   RxID:   [id number]

## 2010-09-27 NOTE — Letter (Signed)
   [place] [facility] [address] [place], [place]  [address]  [place] Phone: [phone]      August 15, 2010   Sean Myers [address] [place], [place] [address]  RE:  LAB RESULTS  Dear  [name],  The following is an interpretation of your most recent lab tests.  Please take note of any instructions provided or changes to medications that have resulted from your lab work.  ELECTROLYTES:  Good - no changes needed  KIDNEY FUNCTION TESTS:  Good - no changes needed  LIVER FUNCTION TESTS:  Good - no changes needed  LIPID PANEL:  Fair - review at your next visit Triglyceride: 68   Cholesterol: 203   LDL: 147   HDL: 42   Chol/HDL%:  4.8 Ratio   DIABETIC STUDIES:  Good - no changes needed Blood Glucose: 96   HgbA1C: 6.1   Microalbumin/Creatinine Ratio: 39.3       Please make sure you take Lipitor regularly.     Sincerely Yours,    Dr. Thomos Lemons  Appended Document:  mailed

## 2010-09-28 ENCOUNTER — Other Ambulatory Visit: Payer: Self-pay | Admitting: Internal Medicine

## 2010-10-02 ENCOUNTER — Other Ambulatory Visit: Payer: Self-pay | Admitting: Internal Medicine

## 2010-10-04 ENCOUNTER — Telehealth: Payer: Self-pay | Admitting: Internal Medicine

## 2010-10-08 ENCOUNTER — Ambulatory Visit: Payer: Self-pay | Admitting: Internal Medicine

## 2010-10-11 NOTE — Progress Notes (Signed)
Summary: future appt  Phone Note Call from Patient   Caller: Patient Call For: nurse Reason for Call: Talk to Nurse Summary of Call: pt has appts on 2.13.12 at 10.30 for 6 mth follow up and appt on 2.20.12 for 2 mth follow up. pt wants to know which appt she can cancel?? phone [phone]. please assist. ss Initial call taken by: Elba Barman,  October 04, 2010 9:51 AM  Follow-up for Phone Call        Left message on machine to return my call. Nicki Guadalajara Fergerson CMA Duncan Dull)  October 04, 2010 2:04 PM   Additional Follow-up for Phone Call Additional follow up Details #1::        call placed to patient at [phone], he has advised to cancel his appointment that is scheduled for 2/13 and he will keep the one scheduled for the 20th. Additional Follow-up by: Glendell Docker CMA,  October 05, 2010 8:31 AM

## 2010-10-15 ENCOUNTER — Ambulatory Visit (INDEPENDENT_AMBULATORY_CARE_PROVIDER_SITE_OTHER): Payer: BC Managed Care – PPO | Admitting: Internal Medicine

## 2010-10-15 ENCOUNTER — Encounter: Payer: Self-pay | Admitting: Internal Medicine

## 2010-10-15 DIAGNOSIS — I10 Essential (primary) hypertension: Secondary | ICD-10-CM

## 2010-10-15 DIAGNOSIS — R7309 Other abnormal glucose: Secondary | ICD-10-CM

## 2010-10-15 LAB — CONVERTED CEMR LAB
BUN: 12 mg/dL (ref 6–23)
CO2: 25 meq/L (ref 19–32)
Calcium: 9.4 mg/dL (ref 8.4–10.5)
Chloride: 104 meq/L (ref 96–112)
Creatinine, Ser: 1.2 mg/dL (ref 0.40–1.50)
Glucose, Bld: 84 mg/dL (ref 70–99)
Potassium: 3.7 meq/L (ref 3.5–5.3)
Sodium: 139 meq/L (ref 135–145)

## 2010-10-17 ENCOUNTER — Encounter: Payer: Self-pay | Admitting: Internal Medicine

## 2010-10-23 NOTE — Letter (Signed)
   [place] [facility] [address] [place], [place]  [address]  [place] Phone: [phone]      October 17, 2010   MELIK BLANCETT [address] [place], [place] [address]  RE:  LAB RESULTS  Dear  Mr. Dangerfield,  The following is an interpretation of your most recent lab tests.  Please take note of any instructions provided or changes to medications that have resulted from your lab work.  ELECTROLYTES:  Good - no changes needed  KIDNEY FUNCTION TESTS:  Good - no changes needed          Sincerely Yours,    Dr. Thomos Lemons  Appended Document:  mailed

## 2010-10-29 ENCOUNTER — Encounter: Payer: Self-pay | Admitting: Internal Medicine

## 2010-10-29 ENCOUNTER — Ambulatory Visit (INDEPENDENT_AMBULATORY_CARE_PROVIDER_SITE_OTHER): Payer: BC Managed Care – PPO | Admitting: Internal Medicine

## 2010-10-29 DIAGNOSIS — Z1211 Encounter for screening for malignant neoplasm of colon: Secondary | ICD-10-CM

## 2010-10-29 DIAGNOSIS — I679 Cerebrovascular disease, unspecified: Secondary | ICD-10-CM

## 2010-10-29 DIAGNOSIS — Z01818 Encounter for other preprocedural examination: Secondary | ICD-10-CM

## 2010-11-01 ENCOUNTER — Other Ambulatory Visit (AMBULATORY_SURGERY_CENTER): Payer: BC Managed Care – PPO | Admitting: Internal Medicine

## 2010-11-01 ENCOUNTER — Encounter: Payer: Self-pay | Admitting: Internal Medicine

## 2010-11-01 DIAGNOSIS — Z1211 Encounter for screening for malignant neoplasm of colon: Secondary | ICD-10-CM

## 2010-11-01 DIAGNOSIS — K648 Other hemorrhoids: Secondary | ICD-10-CM

## 2010-11-01 DIAGNOSIS — K5730 Diverticulosis of large intestine without perforation or abscess without bleeding: Secondary | ICD-10-CM

## 2010-11-01 LAB — HM COLONOSCOPY

## 2010-11-06 NOTE — Letter (Signed)
Summary: [facility] Instructions  [facility]  [address] [place], [place] [address]   Phone: [phone]  Fax: [phone]       Sean Myers    1946-04-20    MRN: [id number]        Procedure Day /Date:THURSDAY 11/01/2010     Arrival Time:10AM     Procedure Time:11AM     Location of Procedure:                    X   [facility] (4th Floor)   PREPARATION FOR COLONOSCOPY WITH MOVIPREP   Starting 5 days prior to your procedure3/10/2010 do not eat nuts, seeds, popcorn, corn, beans, peas,  salads, or any raw vegetables.  Do not take any fiber supplements (e.g. Metamucil, Citrucel, and Benefiber).  THE DAY BEFORE YOUR PROCEDURE         DATE: 10/31/2010  DAY: WEDNESDAY  1.  Drink clear liquids the entire day-NO SOLID FOOD  2.  Do not drink anything colored red or purple.  Avoid juices with pulp.  No orange juice.  3.  Drink at least 64 oz. (8 glasses) of fluid/clear liquids during the day to prevent dehydration and help the prep work efficiently.  CLEAR LIQUIDS INCLUDE: Water Jello Ice Popsicles Tea (sugar ok, no milk/cream) Powdered fruit flavored drinks Coffee (sugar ok, no milk/cream) Gatorade Juice: apple, white grape, white cranberry  Lemonade Clear bullion, consomm, broth Carbonated beverages (any kind) Strained chicken noodle soup Hard Candy                             4.  In the morning, mix first dose of MoviPrep solution:    Empty 1 Pouch A and 1 Pouch B into the disposable container    Add lukewarm drinking water to the top line of the container. Mix to dissolve    Refrigerate (mixed solution should be used within 24 hrs)  5.  Begin drinking the prep at 5:00 p.m. The MoviPrep container is divided by 4 marks.   Every 15 minutes drink the solution down to the next mark (approximately 8 oz) until the full liter is complete.   6.  Follow completed prep with 16 oz of clear liquid of your choice (Nothing red or purple).  Continue  to drink clear liquids until bedtime.  7.  Before going to bed, mix second dose of MoviPrep solution:    Empty 1 Pouch A and 1 Pouch B into the disposable container    Add lukewarm drinking water to the top line of the container. Mix to dissolve    Refrigerate  THE DAY OF YOUR PROCEDURE      DATE: 11/01/2010 DAY: THURSDAY  Beginning at 6a.m. (5 hours before procedure):         1. Every 15 minutes, drink the solution down to the next mark (approx 8 oz) until the full liter is complete.  2. Follow completed prep with 16 oz. of clear liquid of your choice.    3. You may drink clear liquids until 9AM (2 HOURS BEFORE PROCEDURE).   MEDICATION INSTRUCTIONS  Unless otherwise instructed, you should take regular prescription medications with a small sip of water   as early as possible the morning of your procedure.   YOU HAVE ALREADY BEEN OFF PLAVIX FOR 1 MONTH, CONTINUE TO HOLD UNTIL AFTER  YOUR PROCEDURE        OTHER  INSTRUCTIONS  You will need a responsible adult at least 66 years of age to accompany you and drive you home.   This person must remain in the waiting room during your procedure.  Wear loose fitting clothing that is easily removed.  Leave jewelry and other valuables at home.  However, you may wish to bring a book to read or  an iPod/MP3 player to listen to music as you wait for your procedure to start.  Remove all body piercing jewelry and leave at home.  Total time from sign-in until discharge is approximately 2-3 hours.  You should go home directly after your procedure and rest.  You can resume normal activities the  day after your procedure.  The day of your procedure you should not:   Drive   Make legal decisions   Operate machinery   Drink alcohol   Return to work  You will receive specific instructions about eating, activities and medications before you leave.    The above instructions have been reviewed and explained to me by    _______________________    I fully understand and can verbalize these instructions _____________________________ Date _________

## 2010-11-06 NOTE — Assessment & Plan Note (Signed)
Summary: FOR SCREENING COLON, ON PLAVIX   History of Present Illness Visit Type: Initial Consult Primary GI MD: Stan Head MD [facility] Primary Provider: Thomos Lemons, DO Chief Complaint: Colon screening, patient not currrently taking Plavix History of Present Illness:   65 yo African-american man. No GI symptoms. he is here to discuss screening colonoscopy. He takes Plavix for prior stroke but actually held it himself and has not taken in 1 month. wife is here and participates in the history. Last colonoscopy over 10 years ago in [place] and was "ok".         Preventive Screening-Counseling & Management      Drug Use:  yes.      Current Medications (verified): 1)  Plavix 75 Mg  Tabs (Clopidogrel Bisulfate) .... One By Mouth Once Daily Hold 2)  Lipitor 40 Mg Tabs (Atorvastatin Calcium) .... One By Mouth Once Daily 3)  Losartan Potassium-Hctz 100-12.5 Mg Tabs (Losartan Potassium-Hctz) .... One By Mouth Once Daily 4)  Amlodipine Besylate 5 Mg  Tabs (Amlodipine Besylate) .... Take 1 Tablet By Mouth Once A Day 5)  Viagra 100 Mg  Tabs (Sildenafil Citrate) .... Take As Directed and As Needed 6)  Accu-Chek Aviva  Strp (Glucose Blood) .... Use Daily As Directed 7)  Accu-Chek Multiclix Lancets  Misc (Lancets) .... Use As Directed  Allergies (verified): 1)  ! Asa 2)  ! Ipol (Poliovirus Vaccine Inactivated) 3)  ! Pcn  Past History:  Past Medical History: ERECTILE DYSFUNCTION (ICD-302.72) DEPRESSION (ICD-311)    HYPERLIPIDEMIA (ICD-272.4)   HYPERTENSION (ICD-401.9)  CEREBROVASCULAR ACCIDENT, HX OF (ICD-V12.50) PEPTIC ULCER DISEASE (ICD-533.90) Duodenal DIABETES MELLITUS, TYPE II (ICD-250.00)       Family History: No FH of Colon Cancer: Family History of Heart Disease: Mother  Social History: Retired Married Current Smoker  Alcohol use-no       son Lavone Neri living in [place] Research scientist (life sciences)) -  engaged to be married Daily Caffeine Use Illicit Drug Use - yes marijuana Drug  Use:  yes  Review of Systems       some memeory loss after stroke  Vital Signs:  Patient profile:   65 year old male Height:      70.5 inches Weight:      183 pounds BMI:     25.98 Pulse rate:   60 / minute Pulse rhythm:   regular BP sitting:   148 / 92  (left arm) Cuff size:   regular  Vitals Entered By: June McMurray CMA Duncan Dull) (October 29, 2010 10:32 AM)  Physical Exam  General:  Well developed, well nourished, no acute distress. Lungs:  Clear throughout to auscultation. Heart:  Regular rate and rhythm; no murmurs, rubs,  or bruits. Abdomen:  soft and nontender Rectal:  deferred until time of colonoscopy.     Impression & Recommendations:  Problem # 1:  SCREENING, COLON CANCER (ICD-V76.51) Assessment New Risks, benefits,and indications of endoscopic procedure(s) were reviewed with the patient and all questions answered.  Orders: Colonoscopy (Colon)  Problem # 2:  PREOPERATIVE EXAMINATION (ICD-V72.84) Assessment: New  Problem # 3:  CEREBROVASCULAR DISEASE (ICD-437.9) Assessment: Unchanged Plavix has been held already (by patient).  Patient Instructions: 1)  Copy sent to : Thomos Lemons, DO 2)  Your Colonoscopy is scheduled on 11/01/2010  at 11am 3)  You can pick up your MoviPrep from your pharmacy today 4)  Please continue to hold your Plavix until after your procedure 5)  Colonoscopy and Flexible Sigmoidoscopy brochure given.  6)  Conscious Sedation brochure given.  7)  The medication list was reviewed and reconciled.  All changed / newly prescribed medications were explained.  A complete medication list was provided to the patient / caregiver. Prescriptions: MOVIPREP 100 GM  SOLR (PEG-KCL-NACL-NASULF-NA ASC-C) As per prep instructions.  #1 x 0   Entered by:   Merri Ray CMA (AAMA)   Authorized by:   Iva Boop MD, [facility]   Signed by:   Merri Ray CMA (AAMA) on 10/29/2010   Method used:   Electronically to        [id number]  (retail)       [address]       [place], [place]  [address]       Ph: [phone] or [phone]       Fax: [phone]   RxID:   [id number]

## 2010-11-06 NOTE — Procedures (Signed)
Summary: Colonoscopy  Patient: [name] Note: All result statuses are Final unless otherwise noted.  Tests: (1) Colonoscopy (COL)   COL Colonoscopy           DONE     [facility]     [address]     [place], [place]  [address]          COLONOSCOPY PROCEDURE REPORT          PATIENT:  Sean Myers, Sean Myers  MR#:  [id number]     BIRTHDATE:  04-Apr-1946, 64 yrs. old  GENDER:  male     ENDOSCOPIST:  Iva Boop, MD, [facility]     REF. BY:  Thomos Lemons, DO     PROCEDURE DATE:  11/01/2010     PROCEDURE:  Colonoscopy [address]     ASA CLASS:  Class II     INDICATIONS:  Routine Risk Screening     MEDICATIONS:   Fentanyl 50 mcg IV, Versed 7 mg IV          DESCRIPTION OF PROCEDURE:   After the risks benefits and     alternatives of the procedure were thoroughly explained, informed     consent was obtained.  Digital rectal exam was performed and     revealed no abnormalities and normal prostate.   The LB CF-H180AL     [id number] endoscope was introduced through the anus and advanced to     the cecum, which was identified by both the appendix and ileocecal     valve (photo inadvertently omitted), without limitations.  The     quality of the prep was excellent, using MoviPrep.  The instrument     was then slowly withdrawn as the colon was fully examined.     Insertion: 2:02 minutes Withdrawal: 9:29 minutes     <<PROCEDUREIMAGES>>          FINDINGS:  Moderate diverticulosis was found in the sigmoid colon.     This was otherwise a normal examination of the colon. Including     right colon retroflexion.   Retroflexed views in the rectum     revealed internal hemorrhoids.    The scope was then withdrawn     from the patient and the procedure completed.          COMPLICATIONS:  None     ENDOSCOPIC IMPRESSION:     1) Moderate diverticulosis in the sigmoid colon     2) Internal hemorrhoids     3) Otherwise normal examination, excellent prep          REPEAT EXAM:  In 10 year(s) for  routine screening colonoscopy.          Iva Boop, MD, [facility]          CC:  Thomos Lemons, DO and The Patient          n.     eSIGNED:   Iva Boop at 11/01/2010 12:38 PM          Lenore Cordia, [id number]  Note: An exclamation mark (!) indicates a result that was not dispersed into the flowsheet. Document Creation Date: 11/01/2010 12:39 PM _______________________________________________________________________  (1) Order result status: Final Collection or observation date-time: 11/01/2010 12:30 Requested date-time:  Receipt date-time:  Reported date-time:  Referring Physician:   Ordering Physician: Stan Head [phone]) Specimen Source:  Source: Launa Grill Order Number: [id number] Lab site:   Appended Document: Colonoscopy    Clinical Lists Changes  Observations: Added  new observation of COLONNXTDUE: 10/2020 (11/01/2010 14:07)

## 2010-11-06 NOTE — Assessment & Plan Note (Signed)
Summary: 2 MONTH FOLLOW UP/mhf   Vital Signs:  Patient profile:   65 year old male Height:      70.5 inches Weight:      182.50 pounds BMI:     25.91 O2 Sat:      97 % on Room air Temp:     97.9 degrees F oral Pulse rate:   71 / minute Resp:     16 per minute BP sitting:   122 / 80  (left arm) Cuff size:   large  Vitals Entered By: Glendell Docker CMA (October 15, 2010 10:53 AM)  O2 Flow:  Room air CC: 2 month follow up Is Patient Diabetic? No Pain Assessment Patient in pain? no      Comments blood sugars are stable at  100-no concerns, discuss Losartan    Primary Care Provider:  Dondra Spry DO  CC:  2 month follow up.  History of Present Illness:  Hypertension Follow-Up      This is a 65 year old man who presents for Hypertension follow-up.  The patient denies headaches and edema.  The patient denies the following associated symptoms: chest pain.  Compliance with medications (by patient report) has been near 100%.  cough/throat irritation improved since stopping ACE  DM II - stable  Preventive Screening-Counseling & Management  Alcohol-Tobacco     Smoking Status: current  Allergies: 1)  ! Asa 2)  ! Ipol (Poliovirus Vaccine Inactivated) 3)  ! Pcn  Past History:  Past Medical History: DUODENAL ULCER, HX OF (ICD-V12.71) ERECTILE DYSFUNCTION (ICD-302.72) DEPRESSION (ICD-311)    HYPERLIPIDEMIA (ICD-272.4)   HYPERTENSION (ICD-401.9)  CEREBROVASCULAR ACCIDENT, HX OF (ICD-V12.50) PEPTIC ULCER DISEASE (ICD-533.90) DIABETES MELLITUS, TYPE II (ICD-250.00)       Past Surgical History: colonoscopy 1990's - Marcy Panning)   Social History: Retired Married Current Smoker  Alcohol use-no       son - Lavone Neri living in [place] ([place]) -  engaged to be married  Physical Exam  General:  alert, well-developed, and well-nourished.   Lungs:  normal respiratory effort and normal breath sounds.   Heart:  normal rate, regular rhythm, and no gallop.     Extremities:  No lower extremity edema  Psych:  memory intact for recent and remote, normally interactive, good eye contact, not anxious appearing, and not depressed appearing.     Impression & Recommendations:  Problem # 1:  HYPERTENSION (ICD-401.9) Assessment Improved cough/throat irritation resolved since stopping ACE  His updated medication list for this problem includes:    Losartan Potassium-hctz 100-12.5 Mg Tabs (Losartan potassium-hctz) ..... One by mouth once daily    Amlodipine Besylate 5 Mg Tabs (Amlodipine besylate) .Marland Kitchen... Take 1 tablet by mouth once a day  Orders: T-Basic Metabolic Panel [phone])  BP today: 122/80 Prior BP: 140/90 (08/14/2010)  Labs Reviewed: K+: 4.0 (08/14/2010) Creat: : 1.00 (08/14/2010)   Chol: 203 (08/14/2010)   HDL: 42 (08/14/2010)   LDL: 147 (08/14/2010)   TG: 68 (08/14/2010)  Problem # 2:  DIABETES MELLITUS, TYPE II, BORDERLINE (ICD-790.29) Assessment: Unchanged continue dietary mgt  Labs Reviewed: Creat: 1.00 (08/14/2010)     Complete Medication List: 1)  Plavix 75 Mg Tabs (Clopidogrel bisulfate) .... One by mouth once daily 2)  Lipitor 40 Mg Tabs (Atorvastatin calcium) .... One by mouth once daily 3)  Losartan Potassium-hctz 100-12.5 Mg Tabs (Losartan potassium-hctz) .... One by mouth once daily 4)  Amlodipine Besylate 5 Mg Tabs (Amlodipine besylate) .... Take 1 tablet by mouth once  a day 5)  Viagra 100 Mg Tabs (Sildenafil citrate) .... Take as directed and as needed 6)  Sertraline Hcl 25 Mg Tabs (Sertraline hcl) .Marland Kitchen.. 1 by mouth qd 7)  Accu-chek Aviva Strp (Glucose blood) .... Use daily as directed 8)  Accu-chek Multiclix Lancets Misc (Lancets) .... Use as directed  Patient Instructions: 1)  Please schedule a follow-up appointment in 4 months 2)  BMP prior to visit, ICD-9:  401.9 3)  HbgA1C prior to visit, ICD-9:  790.29 4)  Please return for lab work one (1) week before your next appointment.  Prescriptions: LOSARTAN  POTASSIUM-HCTZ 100-12.5 MG TABS (LOSARTAN POTASSIUM-HCTZ) one by mouth once daily  #90 x 1   Entered and Authorized by:   D. Thomos Lemons DO   Signed by:   D. Thomos Lemons DO on 10/15/2010   Method used:   Electronically to        [id number] (retail)       [address]       [place], [place]  [address]       Ph: [phone] or [phone]       Fax: [phone]   RxID:   [id number]    Orders Added: 1)  T-Basic Metabolic Panel [phone] 2)  Est. Patient Level III [[address]]

## 2011-01-04 ENCOUNTER — Encounter: Payer: Self-pay | Admitting: Internal Medicine

## 2011-01-07 ENCOUNTER — Ambulatory Visit: Payer: BC Managed Care – PPO | Admitting: Internal Medicine

## 2011-01-11 NOTE — Assessment & Plan Note (Signed)
[facility]                                 ON-CALL NOTE   CERRONE, DEBOLD                     MRN:          [id number]  DATE:03/17/2007                            DOB:          August 29, 1945    Patient of Dr. Artist Pais.   Called from [phone] at 7:14 p.m. on March 17, 2007 asking for a Viagra  refill.  I did call in Viagra 100 mg #10 pills with no refills, but  explained that he needed to get further refills from his primary care  doctor during the day, and that this is not something that should be  requesting after hours.     Lelon Perla, DO  Electronically Signed    Shawnie Dapper  DD: 03/17/2007  DT: 03/18/2007  Job #: [id number]   cc:   Barbette Hair. Artist Pais, DO

## 2011-01-11 NOTE — H&P (Signed)
NAME:  NEYLAND, PETTENGILL              ACCOUNT NO.:  [id number]   MEDICAL RECORD NO.:  [id number]          PATIENT TYPE:  INP   LOCATION:  3033                         FACILITY:  [facility]   PHYSICIAN:  Pramod P. Pearlean Brownie, MD    DATE OF BIRTH:  27-Jul-1946   DATE OF ADMISSION:  07/16/2004  DATE OF DISCHARGE:                                HISTORY & PHYSICAL   REFERRING PHYSICIAN:  __________, M.D.   REASON FOR REFERRAL:  Stroke.   HISTORY OF PRESENT ILLNESS:  Mr. Mcenery is a 65 year old African-American  gentleman who developed sudden onset of visual difficulties with gait  imbalance problems on Thursday, 11/17, at work.  The patient noticed  difficulty reading the labels off some boxes.  He works as a Surveyor, quantity and  he was able to concentrate and continue working.  He also felt  dizzy and off balance.  He left work early and, with great difficulty, was  able to drive slowly back home.  His wife noticed that he was not right, but  he refused to go to the emergency room and stayed home.  __________  He  stayed in bed all day.  On Saturday morning, the patient woke up complaining  of chest pain for a few hours which resolved by itself.  The wife noted that  he was bumping into objects and was walking with a straggling gait.  She  finally convinced him to come to the emergency room on Sunday night.  He was  seen in the [facility] emergency room and a CT scan there showed a subacute  right occipital and medial temporal infarct.  The patient was transferred to  [facility] early this morning for a stroke workup.   PAST MEDICAL HISTORY:  Not significant for any previous strokes, TIA,  migraines, seizures, or neurological problems.   PAST MEDICAL HISTORY:  Unremarkable.  No history of hypertension, diabetes,  heart disease, or hyperlipidemia.   PAST SURGICAL HISTORY:  None.   MEDICATION ALLERGIES:  Aspirin, sensitivity to stomach.   FAMILY HISTORY:  Significant for father  having a stroke.   SOCIAL HISTORY:  The patient lives in [place] with his wife.  He works as  a Designer, industrial/product and does not drink or do drugs.  He smokes a half pack  per day for several years.  His family physician is Renaye Rakers, M.D.   REVIEW OF SYSTEMS:  Significant for chest pain on Saturday night.  No  palpitations, shortness of breath, cough, diarrhea.   PHYSICAL EXAMINATION:  GENERAL:  A well-developed African-American, middle-  aged gentleman who is not afebrile.  VITAL SIGNS:  Temperature 98.2, pulse 95 and regular, blood pressure 140/80,  respiratory rate 18, oxygen saturations 94% on room air.  Distal pulses are  well heard and __________  NECK:  Supple without bruit.  ENT:  Exam unremarkable.  CARDIAC:  No murmur or gallop.  LUNGS:  Clear to auscultation.  NEUROLOGIC:  The patient is awake and alert, oriented x 3.  He has  diminished attention, short-term memory, __________, and recall.  There is  no aphasia, apraxia, or dysarthria.  Pupils are equal and reactive to light  and accommodation.  Movements are full range without nystagmus.  He has a  dense left homonymous hemianopsia.  Face is symmetric with left lower facial  asymmetry.  Motor system exam reveals no upper extremity drift.  He,  however, has 4+/5 weakness on the left side.  The left grip is significantly  weak as is the left elbow extensors.  Deep tendon reflexes are 1+ on the  right, 2+ on the left.  Left ___________ is equivocal.  The right is  downgoing.  He has intact finger-to-nose and __________ coordination on the  left.  He has decreased touch, pin prick, and position sensation on the left  side compared to the right.  There is no sensory extinction or neglect seen.  He walks with an ataxic gait holding on to one person for assistance.   DIAGNOSTIC STUDIES:  An MRI scan of the brain done today reveals a large  complete territory right posterior cerebral artery infarction including the   splenium of the corpus callosum.  The right posterior cerebral artery is  occluded 1 cm from its origin, a likely embolic occlusion.  Admission labs  revealed normal basic metabolic panel, CBC, and cardiac enzymes.  EKG  reveals no acute ischemic findings.   IMPRESSION:  A 65 year old gentleman with subacute right posterior cerebral  artery complete territory infarction __________ embolic occlusion of the  right posterior cerebral artery, etiology to be decided yet.   PLAN:  The patient is being admitted to the stroke unit with telemetry  monitoring for further stroke risk stratification and workup.  Start Plavix  as secondary stroke prevention and will obtain cardiac echocardiogram,  fasting lipid profile, hemoglobin A1C, homocysteine, and carotid __________  Doppler studies.  The patient will need physical and occupational therapy  consults as well as rehab consults.  I had a long discussion with the  patient and his wife regarding the nature of symptoms, discussed plan for  evaluation and treatment, and answered questions.       PPS/MEDQ  D:  07/16/2004  T:  07/16/2004  Job:  [id number]   cc:   Renaye Rakers, M.D.  [phone] [address]  [place]  [place] [address]  Fax: [phone]

## 2011-01-11 NOTE — Letter (Signed)
June 24, 2006     Georgia Lopes, M.D.  [facility]  [address]  [place], [place] [address]   RE:  SEVERIANO, UTSEY  MRN:  [id number]  /  DOB:  March 23, 1946   Dear Dr. Barbette Merino,   This letter is in regards to medical clearance for upcoming oral surgery for  patient Sean Myers. Mr. Poole is a 65 year old African-American male  with a past medical history of CVA in November 2005, type 2 diabetes,  hypertension and hyperlipidemia. The patient's blood pressure is well-  controlled and has been on Plavix for secondary stroke prevention as he has  a history of intolerance to aspirin secondary to peptic ulcer disease.   He does not have any cardiac history, no history of aortic stenosis or  valvular abnormality.   I last examined the patient on April 04, 2006. We discussed complete  smoking cessation, but his diabetes and dyslipidemia were well-controlled.   The patient is relatively at low risk for upcoming procedure. In terms of  antiplatelet therapy, I asked the patient to discontinue Plavix 5-7 days  prior to oral surgery and resume 5 days after procedure. In addition, he was  instructed to hold his Zocor one day before surgery. The patient should  continue taking his antihypertensives perioperatively.      If you have any further questions or concerns, please feel free to contact  me.   Sincerely,      Barbette Hair. Artist Pais, DO    RDY/MedQ  DD: 06/24/2006  DT: 06/24/2006  Job #: [id number]

## 2011-01-11 NOTE — Discharge Summary (Signed)
NAME:  Sean Myers, Sean Myers NO.:  [id number]   MEDICAL RECORD NO.:  [id number]          PATIENT TYPE:  INP   LOCATION:  3033                         FACILITY:  [facility]   PHYSICIAN:  Melvyn Novas, M.D.  DATE OF BIRTH:  09-24-1945   DATE OF ADMISSION:  07/16/2004  DATE OF DISCHARGE:  07/19/2004                                 DISCHARGE SUMMARY   HISTORY OF PRESENT ILLNESS:  Sean Myers appeared at work on July 15, 2004, and had trouble registering objects to the left side of his visual  field.  Actually, co-workers seemed to have made fun of him, and he was  unaware that he had any impairment and got rather upset.  It was the next  day that he had gait imbalance.  He did have a spell of gait imbalance  already on November 17th at work, and he had at that time difficulties  reading labels of boxes that he was supposed to pack.  He works as a  Designer, industrial/product.  So his admission to the hospital occurred within four  days after the first stroke symptoms were actually evident.   PAST MEDICAL HISTORY:  The patient has no past medical history except for  duodenal ulcers.   MEDICATIONS:  He was only on Tagamet p.r.n.   ALLERGIES:  He states that he was SENSITIVE TO ASPIRIN due to his stomach  history.   HOSPITAL COURSE:  Sean Myers is a 65 year old, African-American, right-  handed gentleman, who looks younger than his numeric age.  He presented in  his neurologic examination with very mild left hemiparesis marked by a  pronator drift only, but no grip strength loss.  The patient also had dense  left homonomous hemianopsia and dorsiflexion weakness on the left leg.  He  lost primary modality sensory throughout the left body part.   A 2-D echo was obtained and showed no evidence of thrombosis or source of  emboli, and an ejection fraction is in normal range.  Intracranial Dopplers  and carotid Dopplers were normal range.  Homocystine level was pending.  An  MRI  showed what the CT had already indicated, a right-sided PCA infarct with  multifocal right-sided ischemic strokes involving the thalamus, deep white  matter, and parietal cortex.   Physical Therapy evaluated the patient as well as Rehab and found that he  would be a good candidate to continue rehab in an outpatient basis.  Concern  was that of to keep the patient from driving his car, which he apparently is  now willing to do, as well as seeing some impairment in judgment and  discussing with him that he is accident- prone due to his homonomous  hemianopsia to the left.  Patient will be discharged.  His family physician  is Sean Myers, who will follow Sean Myers as his stroke  attending.  The patient's wife will bring [place] FMLA papers to the Stroke  Office on the third floor.  The patient will be discharged today on  Thanksgiving Day to his home and is supposed to continue on Monday with  outpatient rehab.  He will be contacted by the appropriate office.  Follow  up is with Sean Myers within three to four weeks, follow up with Sean Myers within two weeks.  The patient had an elevated hemoglobin A1c level at  6.8 and could be considered diabetic.  The patient's protein, CS, and  homocystine level as well as lupus anticoagulant were still pending as of  this morning.  His HDL cholesterol was too low at 34, LDL was elevated at  125, and will need to be addressed by his primary care.  It is recommended  for the patient to use Protonix or Pepcid to not redevelop a duodenal ulcer,  but I do think that he will need to be on anti-platelet therapy, and I would  recommend a baby aspirin a day 81 mg, as well as a multivitamin with folic  acid.  The patient  here was started on Plavix and if this is physically prudent for him to stay  on Plavix, this could be his medication at home.  If he does not have a  prescription coverage for Plavix, I would recommend that he switches to an   aspirin a day.       CD/MEDQ  D:  07/19/2004  T:  07/19/2004  Job:  [id number]   cc:   [name], M.D.  [facility] [address]  [place]  [place] [address]  Fax: [phone]   Pramod P. Pearlean Brownie, MD  Fax: [phone]

## 2011-01-11 NOTE — Assessment & Plan Note (Signed)
[facility]                                 ON-CALL NOTE   Sean Myers, Sean Myers                       MRN:          [id number]  DATE:10/04/2006                            DOB:          08/03/1946    Wife, Okey Regal, calling.   Patient does not feel well.  He has had congestion and runny nose,  nonproductive cough and aching allover.  Advised this sounds like the  flu virus going around.  Treat symptomatically at home.     Jeffrey A. Tawanna Cooler, MD  Electronically Signed    JAT/MedQ  DD: 10/05/2006  DT: 10/05/2006  Job #: [id number]

## 2011-01-15 ENCOUNTER — Ambulatory Visit: Payer: BC Managed Care – PPO | Admitting: Internal Medicine

## 2011-01-24 ENCOUNTER — Ambulatory Visit: Payer: BC Managed Care – PPO | Admitting: Internal Medicine

## 2011-01-31 ENCOUNTER — Ambulatory Visit: Payer: BC Managed Care – PPO | Admitting: Internal Medicine

## 2011-02-04 ENCOUNTER — Ambulatory Visit: Payer: BC Managed Care – PPO | Admitting: Internal Medicine

## 2011-03-19 ENCOUNTER — Ambulatory Visit: Payer: BC Managed Care – PPO | Admitting: Internal Medicine

## 2011-03-21 ENCOUNTER — Encounter: Payer: Self-pay | Admitting: Internal Medicine

## 2011-03-21 ENCOUNTER — Ambulatory Visit (INDEPENDENT_AMBULATORY_CARE_PROVIDER_SITE_OTHER): Payer: BC Managed Care – PPO | Admitting: Internal Medicine

## 2011-03-21 DIAGNOSIS — E785 Hyperlipidemia, unspecified: Secondary | ICD-10-CM

## 2011-03-21 DIAGNOSIS — R7309 Other abnormal glucose: Secondary | ICD-10-CM

## 2011-03-21 DIAGNOSIS — N400 Enlarged prostate without lower urinary tract symptoms: Secondary | ICD-10-CM

## 2011-03-21 DIAGNOSIS — N401 Enlarged prostate with lower urinary tract symptoms: Secondary | ICD-10-CM

## 2011-03-21 DIAGNOSIS — N138 Other obstructive and reflux uropathy: Secondary | ICD-10-CM

## 2011-03-21 DIAGNOSIS — E119 Type 2 diabetes mellitus without complications: Secondary | ICD-10-CM

## 2011-03-21 DIAGNOSIS — I10 Essential (primary) hypertension: Secondary | ICD-10-CM

## 2011-03-21 LAB — LIPID PANEL
Cholesterol: 187 mg/dL (ref 0–200)
HDL: 42 mg/dL (ref >39)
Total CHOL/HDL Ratio: 4.5 Ratio
Triglycerides: 75 mg/dL (ref <150)

## 2011-03-21 LAB — HEMOGLOBIN A1C: Hgb A1c MFr Bld: 6.1 % — ABNORMAL HIGH (ref <5.7)

## 2011-03-21 LAB — HEPATIC FUNCTION PANEL
ALT: 11 U/L (ref 0–53)
AST: 17 U/L (ref 0–37)
Albumin: 4.3 g/dL (ref 3.5–5.2)
Alkaline Phosphatase: 90 U/L (ref 39–117)
Bilirubin, Direct: 0.2 mg/dL (ref 0.0–0.3)
Total Bilirubin: 0.7 mg/dL (ref 0.3–1.2)
Total Protein: 7.8 g/dL (ref 6.0–8.3)

## 2011-03-21 LAB — PSA: PSA: 5.16 ng/mL — ABNORMAL HIGH (ref <=4.00)

## 2011-03-21 NOTE — Assessment & Plan Note (Signed)
Minimal suboptimal control. Recommend out patient blood pressure log to be submitted for review. Continue current regimen.

## 2011-03-21 NOTE — Assessment & Plan Note (Signed)
Obtain PSA.  Begin saw palmetto. Followup if no improvement or worsening.

## 2011-03-21 NOTE — Progress Notes (Signed)
  Subjective:    Patient ID: Sean Myers, male    DOB: 1946/08/04, 65 y.o.   MRN: [id number]  HPI Patient presents to clinic for evaluation of multiple medical problems. History of past stroke with no new neurologic deficits. Blood pressure reviewed minimally elevated. Compliant with medication without adverse effect. No bleeding with Plavix. Complains of decreased urinary stream. No exacerbating or alleviating factors. Tolerates statin therapy without myalgias or abnormal LFTs. No other complaints.  Reviewed past medical history, medications and allergies  Review of Systems see history of present illness     Objective:   Physical Exam  Physical Exam  Vitals reviewed. Constitutional:  appears well-developed and well-nourished. No distress.  HENT:  Head: Normocephalic and atraumatic.  Nose: Nose normal.  Mouth/Throat: Oropharynx is clear and moist. No oropharyngeal exudate.  Eyes: Conjunctivae clear. Pupils are equal, round, and reactive to light. Right eye exhibits no discharge. Left eye exhibits no discharge. No scleral icterus.  Neck: Neck supple. No thyromegaly present.  Cardiovascular: Normal rate, regular rhythm and normal heart sounds.  Exam reveals no gallop and no friction rub.   No murmur heard. Pulmonary/Chest: Effort normal and breath sounds normal. No respiratory distress.  has no wheezes.  has no rales.  Lymphadenopathy:   no cervical adenopathy.  Neurological:  is alert.  Skin: Skin is warm and dry.  not diaphoretic.  Psychiatric: normal mood and affect.        Assessment & Plan:   No problem-specific assessment & plan notes found for this encounter.

## 2011-03-21 NOTE — Assessment & Plan Note (Signed)
Stable. Obtain A1c

## 2011-03-21 NOTE — Assessment & Plan Note (Signed)
Obtain fasting lipid profile and liver function tests. 

## 2011-03-28 ENCOUNTER — Other Ambulatory Visit: Payer: Self-pay | Admitting: Internal Medicine

## 2011-03-28 DIAGNOSIS — E785 Hyperlipidemia, unspecified: Secondary | ICD-10-CM

## 2011-03-28 MED ORDER — ATORVASTATIN CALCIUM 40 MG PO TABS: 40.0000 mg | ORAL_TABLET | Freq: Every day | ORAL | Status: DC

## 2011-07-29 ENCOUNTER — Telehealth: Payer: Self-pay | Admitting: Internal Medicine

## 2011-07-29 ENCOUNTER — Emergency Department (HOSPITAL_COMMUNITY): Payer: BC Managed Care – PPO

## 2011-07-29 ENCOUNTER — Emergency Department (HOSPITAL_COMMUNITY)
Admission: EM | Admit: 2011-07-29 | Discharge: 2011-07-29 | Disposition: A | Discharge status: Home / Self Care | Payer: BC Managed Care – PPO | Attending: Emergency Medicine | Admitting: Emergency Medicine

## 2011-07-29 ENCOUNTER — Telehealth: Payer: Self-pay | Admitting: Family Medicine

## 2011-07-29 ENCOUNTER — Encounter (HOSPITAL_COMMUNITY): Payer: Self-pay | Admitting: Emergency Medicine

## 2011-07-29 ENCOUNTER — Ambulatory Visit: Payer: BC Managed Care – PPO | Admitting: Internal Medicine

## 2011-07-29 DIAGNOSIS — E785 Hyperlipidemia, unspecified: Secondary | ICD-10-CM | POA: Insufficient documentation

## 2011-07-29 DIAGNOSIS — E119 Type 2 diabetes mellitus without complications: Secondary | ICD-10-CM | POA: Insufficient documentation

## 2011-07-29 DIAGNOSIS — Z8679 Personal history of other diseases of the circulatory system: Secondary | ICD-10-CM | POA: Insufficient documentation

## 2011-07-29 DIAGNOSIS — R413 Other amnesia: Secondary | ICD-10-CM

## 2011-07-29 LAB — BASIC METABOLIC PANEL
BUN: 8 mg/dL (ref 6–23)
GFR calc non Af Amer: 84 mL/min — ABNORMAL LOW (ref >90)
Glucose, Bld: 94 mg/dL (ref 70–99)
Potassium: 4 mEq/L (ref 3.5–5.1)

## 2011-07-29 LAB — CBC
HCT: 45.7 % (ref 39.0–52.0)
Hemoglobin: 15.9 g/dL (ref 13.0–17.0)
MCH: 28.8 pg (ref 26.0–34.0)
MCV: 82.8 fL (ref 78.0–100.0)
RBC: 5.52 MIL/uL (ref 4.22–5.81)

## 2011-07-29 LAB — DIFFERENTIAL
Eosinophils Absolute: 0.1 10*3/uL (ref 0.0–0.7)
Lymphs Abs: 2.8 10*3/uL (ref 0.7–4.0)
Monocytes Relative: 8 % (ref 3–12)
Neutrophils Relative %: 46 % (ref 43–77)

## 2011-07-29 NOTE — ED Notes (Signed)
Patient transported to CT 

## 2011-07-29 NOTE — ED Notes (Signed)
Pt started to have s/s of confusion and not able to remember a family member. The pt's family states that he has also had repetitive questioning. They called dr. Olegario Messier office who told him to come in for evaluation and possible mri. S/s onset yesterday afternoon at 1330. This is not a code stroke protocol.

## 2011-07-29 NOTE — ED Notes (Signed)
Pt here from for poss tia , no symptoms at present,  Pt sent by md for MRI

## 2011-07-29 NOTE — Telephone Encounter (Signed)
Received phone call from ER physician (Dr. Harriett Sine).  Patient evaluated for increase in lapse in memory.  Family was concerned he suffered TIA.  Clinical exam not consistent with TIA.  Patient to be discharged with office follow up re:  Progressive memory loss within 1 week.  Please confirm with pt -  He has OV within 1 week
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
## 2011-07-29 NOTE — ED Notes (Signed)
Per Pt wife, yesterday at 1500 pt was unable to answer questions correctly. Pt had no other deficits. Pt sent here by Dr. Artist Pais for MRI. At this time pt does not have any deficits. Pt appropriate, denying any pain. Alert and oriented. NAD

## 2011-07-29 NOTE — Telephone Encounter (Signed)
Triage Record Num: [id number] Operator: Virgel Bouquet Patient Name: Sean Myers Call Date & Time: 07/28/2011 3:31:59PM Patient Phone: [phone] PCP: Thomos Lemons Patient Gender: Male PCP Fax : [phone] Patient DOB: 1945/09/08 [facility] Name: [facility] Reason for Call: Caller: Arsenio Katz; PCP: Thomos Lemons; CB#: [phone]; Call Reason: Confusion; Sx Onset: 07/28/2011; Sx Notes: ; Afebrile; Confusion noted by wife. Not remembering conversations and names of people. New onset today 07/28/2011.Marland Kitchen Has not been taking meds regularly. Disposition ED per Confusion protocol. Wife to transport to ED. Protocol(s) Used: Confusion, Disorientation, Agitation Recommended Outcome per Protocol: See ED Immediately Reason for Outcome: New or worsening confusion, disorientation, or agitation Care Advice: ~ IMMEDIATE ACTION

## 2011-07-30 ENCOUNTER — Other Ambulatory Visit: Payer: Self-pay | Admitting: Internal Medicine

## 2011-07-30 NOTE — Telephone Encounter (Signed)
appt scheduled

## 2011-08-07 ENCOUNTER — Ambulatory Visit: Payer: Medicare Other | Admitting: Internal Medicine

## 2011-08-09 ENCOUNTER — Telehealth: Payer: Self-pay | Admitting: Internal Medicine

## 2011-08-09 ENCOUNTER — Ambulatory Visit: Payer: Medicare Other | Admitting: Internal Medicine

## 2011-08-09 MED ORDER — FLUOXETINE HCL 20 MG PO CAPS: 20.0000 mg | ORAL_CAPSULE | Freq: Every day | ORAL | Status: DC

## 2011-08-09 NOTE — Telephone Encounter (Signed)
Wife reports pt has not been taking his meds.  He is been angry and agitated at home.  He was previously on sertraline but stopped.  He also likely has early dementia.  Trial of fluoxetine.  At next OV in 2 months, discuss starting aricept and/or namenda

## 2011-08-12 MED ORDER — SERTRALINE HCL 25 MG PO TABS: 25.0000 mg | ORAL_TABLET | Freq: Every day | ORAL | Status: DC

## 2011-08-12 NOTE — Telephone Encounter (Signed)
  Called pt - pt advised to go back to taking sertraline instead of fluoxetine due to potential drug interaction with plavix.

## 2011-09-04 ENCOUNTER — Ambulatory Visit (INDEPENDENT_AMBULATORY_CARE_PROVIDER_SITE_OTHER): Payer: BC Managed Care – PPO | Admitting: Internal Medicine

## 2011-09-04 ENCOUNTER — Encounter: Payer: Self-pay | Admitting: Internal Medicine

## 2011-09-04 DIAGNOSIS — R7309 Other abnormal glucose: Secondary | ICD-10-CM

## 2011-09-04 DIAGNOSIS — R413 Other amnesia: Secondary | ICD-10-CM

## 2011-09-04 DIAGNOSIS — I10 Essential (primary) hypertension: Secondary | ICD-10-CM

## 2011-09-04 DIAGNOSIS — R972 Elevated prostate specific antigen [PSA]: Secondary | ICD-10-CM

## 2011-09-04 DIAGNOSIS — F3289 Other specified depressive episodes: Secondary | ICD-10-CM

## 2011-09-04 DIAGNOSIS — F329 Major depressive disorder, single episode, unspecified: Secondary | ICD-10-CM

## 2011-09-04 LAB — POCT URINALYSIS DIPSTICK
Bilirubin, UA: NEGATIVE
Blood, UA: NEGATIVE
Glucose, UA: NEGATIVE
Spec Grav, UA: >=1.03
Urobilinogen, UA: 0.2

## 2011-09-04 MED ORDER — MEMANTINE HCL 5 MG PO TABS: ORAL_TABLET | ORAL | Status: DC

## 2011-09-04 MED ORDER — TAMSULOSIN HCL 0.4 MG PO CAPS: 0.4000 mg | ORAL_CAPSULE | Freq: Every day | ORAL | Status: DC

## 2011-09-04 MED ORDER — RANITIDINE HCL 150 MG PO CAPS: 150.0000 mg | ORAL_CAPSULE | Freq: Two times a day (BID) | ORAL | Status: DC

## 2011-09-04 NOTE — Assessment & Plan Note (Addendum)
Stable.  I urged medication compliance.  BP: 138/90 mmHg  Lab Results  Component Value Date   CREATININE 0.97 07/29/2011

## 2011-09-04 NOTE — Assessment & Plan Note (Signed)
Patient with worsening short term memory loss.  I suspect symptoms may be related to his previous strokes.  Avoid aricept due to his history of possible seizures in the past.  Trial of namenda.

## 2011-09-04 NOTE — Progress Notes (Signed)
Subjective:    Patient ID: Sean Myers, male    DOB: 03/07/1946, 66 y.o.   MRN: [id number]  HPI  66 year old [place] American male for followup regarding multiple complaints. Patient was seen approximately one month ago in the emergency room for memory loss issues. Wife is concerned that he had recurrence of stroke. Patient was evaluated and CT of brain was negative for any new stroke. Patient has been struggling with short-term memory loss for the last 1-2 years. In addition wife reports that he has not been taking his medications over the last one to 2 months. He struggles with depressive symptoms during winter months.  At last office visit with Dr. Rodena Medin patient was found to have elevated PSA. He is having some symptoms of enlarged prostate (weak stream, difficulty starting and stopping). He was referred to urologist but never followed up.     Review of Systems Negative for dysuria, negative for fever,  Dyspepsia (which he attributes to start of sertraline)  Past Medical History  Diagnosis Date  . ED (erectile dysfunction)   . Depression   . Hyperlipidemia   . History of cerebrovascular accident   . Diabetes mellitus, type II     History   Social History  . Marital Status: Married    Spouse Name: N/A    Number of Children: N/A  . Years of Education: N/A   Occupational History  . Not on file.   Social History Main Topics  . Smoking status: Current Everyday Smoker -- 1.0 packs/day  . Smokeless tobacco: Not on file  . Alcohol Use: No  . Drug Use: Yes    Special: Marijuana     marijuana  . Sexually Active: Not on file   Other Topics Concern  . Not on file   Social History Narrative   RetiredMarriedCurrent Smoker Alcohol use-no      son Lavone Neri living in [place] ([place] -  engaged to be married    Past Surgical History  Procedure Date  . Colonoscopy 1990's    Marcy Panning    Family History  Problem Relation Age of Onset  . Heart disease Mother   .  Other      no family history of colon cancer    Allergies  Allergen Reactions  . Poliovirus Vaccine Inactivated     Heart races, cold sweats, "almost killed me".  . Aspirin     Gi upset  . Penicillins Swelling, Palpitations and Rash    Current Outpatient Prescriptions on File Prior to Visit  Medication Sig Dispense Refill  . amLODipine (NORVASC) 5 MG tablet Take 5 mg by mouth daily.        Marland Kitchen glucose blood (ACCU-CHEK AVIVA) test strip Use once a day to check blood sugar       . Lancets (ACCU-CHEK MULTICLIX) lancets Use once a day to check blood sugar       . PLAVIX 75 MG tablet take 1 tablet by mouth once daily  90 tablet  3  . sertraline (ZOLOFT) 25 MG tablet Take 1 tablet (25 mg total) by mouth daily.  90 tablet  1  . sildenafil (VIAGRA) 100 MG tablet Take 100 mg by mouth daily as needed. As needed for erectile dysfunction.        BP 138/90  Pulse 64  Temp(Src) 98.5 F (36.9 C) (Oral)  Ht 5\' 11"  (1.803 m)  Wt 184 lb (83.462 kg)  BMI 25.66 kg/m2       Objective:  Physical Exam  Constitutional: He is oriented to person, place, and time. He appears well-developed and well-nourished.  HENT:  Head: Normocephalic and atraumatic.  Neck: Neck supple.  Cardiovascular: Normal rate, regular rhythm and normal heart sounds.   Pulmonary/Chest: Effort normal and breath sounds normal. No respiratory distress. He has no wheezes.  Genitourinary:       No rectal mass, unable to completely palpate prostate gland  Neurological: He is oriented to person, place, and time.  Psychiatric: He has a normal mood and affect. His behavior is normal.       Assessment & Plan:

## 2011-09-04 NOTE — Assessment & Plan Note (Signed)
Patient has exacerbation during winter months.  Patient urged to stay on SSRI.

## 2011-09-04 NOTE — Assessment & Plan Note (Signed)
Unclear with elevated PSA related to BPH versus prostate cancer. I am unable to fully palpate prostate gland. Refer to urology for further evaluation.  UA was negative for leukocytes or nitrites Lab Results  Component Value Date   PSA 5.16* 03/21/2011   PSA 2.20 11/17/2007   PSA 2.20 11/13/2007

## 2011-10-07 ENCOUNTER — Ambulatory Visit: Payer: BC Managed Care – PPO | Admitting: Internal Medicine

## 2011-10-14 ENCOUNTER — Ambulatory Visit (INDEPENDENT_AMBULATORY_CARE_PROVIDER_SITE_OTHER): Payer: BC Managed Care – PPO | Admitting: Internal Medicine

## 2011-10-14 DIAGNOSIS — R972 Elevated prostate specific antigen [PSA]: Secondary | ICD-10-CM

## 2011-10-14 DIAGNOSIS — F329 Major depressive disorder, single episode, unspecified: Secondary | ICD-10-CM

## 2011-10-14 DIAGNOSIS — I10 Essential (primary) hypertension: Secondary | ICD-10-CM

## 2011-10-14 DIAGNOSIS — R413 Other amnesia: Secondary | ICD-10-CM

## 2011-10-14 MED ORDER — SERTRALINE HCL 50 MG PO TABS: 50.0000 mg | ORAL_TABLET | Freq: Every day | ORAL | Status: DC

## 2011-10-14 MED ORDER — LOSARTAN POTASSIUM 50 MG PO TABS: 50.0000 mg | ORAL_TABLET | Freq: Every day | ORAL | Status: DC

## 2011-10-14 NOTE — Assessment & Plan Note (Signed)
Patient currently undergoing evaluation by urology.

## 2011-10-14 NOTE — Assessment & Plan Note (Signed)
Blood pressure control deteriorated. At the losartan 50 mg once daily along with amlodipine 5 mg once daily. BP: 170/100 mmHg

## 2011-10-14 NOTE — Assessment & Plan Note (Signed)
Improved.  Titrate sertraline to 50 mg.

## 2011-10-14 NOTE — Assessment & Plan Note (Signed)
Pt tolerating namenda.  Consider increase dose at next OV.

## 2011-10-14 NOTE — Progress Notes (Signed)
Subjective:    Patient ID: Sean Myers, male    DOB: [birth year], 66 y.o.   MRN: [id number]  HPI  66 year old [place] American male for followup regarding depression and memory loss. Her previous visit and then that was started. Patient has tolerated well without side effect. In regards to mood issues, wife has noticed improvement after restarting sertraline.  Hypertension-he has not been monitoring his blood pressure at home. His blood pressure is elevated today. He has taken his amlodipine as usual.   Review of Systems Negative for headache or chest pain  Past Medical History  Diagnosis Date  . ED (erectile dysfunction)   . Depression   . Hyperlipidemia   . History of cerebrovascular accident   . Diabetes mellitus, type II     History   Social History  . Marital Status: Married    Spouse Name: N/A    Number of Children: N/A  . Years of Education: N/A   Occupational History  . Not on file.   Social History Main Topics  . Smoking status: Current Everyday Smoker -- 1.0 packs/day  . Smokeless tobacco: Not on file  . Alcohol Use: No  . Drug Use: Yes    Special: Marijuana     marijuana  . Sexually Active: Not on file   Other Topics Concern  . Not on file   Social History Narrative   RetiredMarriedCurrent Smoker Alcohol use-no      son Lavone Neri living in [place] ([place]) -  engaged to be married    Past Surgical History  Procedure Date  . Colonoscopy 1990's    [facility]    Family History  Problem Relation Age of Onset  . Heart disease Mother   . Other      no family history of colon cancer    Allergies  Allergen Reactions  . Poliovirus Vaccine Inactivated     Heart races, cold sweats, "almost killed me".  . Aspirin     Gi upset  . Penicillins Swelling, Palpitations and Rash    Current Outpatient Prescriptions on File Prior to Visit  Medication Sig Dispense Refill  . amLODipine (NORVASC) 5 MG tablet Take 5 mg by mouth daily.        Marland Kitchen  glucose blood (ACCU-CHEK AVIVA) test strip Use once a day to check blood sugar       . Lancets (ACCU-CHEK MULTICLIX) lancets Use once a day to check blood sugar       . memantine (NAMENDA) 5 MG tablet Take one tab daily x 1 week, then one twice daily  60 tablet  0  . PLAVIX 75 MG tablet take 1 tablet by mouth once daily  90 tablet  3  . ranitidine (ZANTAC) 150 MG capsule Take 1 capsule (150 mg total) by mouth 2 (two) times daily.  60 capsule  3  . sildenafil (VIAGRA) 100 MG tablet Take 100 mg by mouth daily as needed. As needed for erectile dysfunction.      . Tamsulosin HCl (FLOMAX) 0.4 MG CAPS Take 1 capsule (0.4 mg total) by mouth daily after supper.  30 capsule  3    BP 170/100  Temp(Src) 98 F (36.7 C) (Oral)  Wt 189 lb (85.73 kg)  SpO2 98%      Objective:   Physical Exam  Constitutional: He is oriented to person, place, and time. He appears well-developed and well-nourished.  Cardiovascular: Normal rate, regular rhythm and normal heart sounds.   Pulmonary/Chest: Effort normal  and breath sounds normal. He has no wheezes. He has no rales.  Musculoskeletal: He exhibits no edema.  Neurological: He is alert and oriented to person, place, and time.  Skin: Skin is warm and dry.  Psychiatric: He has a normal mood and affect. His behavior is normal.          Assessment & Plan:

## 2011-10-28 ENCOUNTER — Ambulatory Visit: Payer: BC Managed Care – PPO | Admitting: Internal Medicine

## 2011-11-11 ENCOUNTER — Ambulatory Visit: Payer: BC Managed Care – PPO | Admitting: Internal Medicine

## 2011-11-13 ENCOUNTER — Ambulatory Visit (INDEPENDENT_AMBULATORY_CARE_PROVIDER_SITE_OTHER): Payer: BC Managed Care – PPO | Admitting: Internal Medicine

## 2011-11-13 ENCOUNTER — Encounter: Payer: Self-pay | Admitting: Internal Medicine

## 2011-11-13 VITALS — BP 172/104 | HR 96 | Temp 98.0°F | Wt 184.0 lb

## 2011-11-13 DIAGNOSIS — I10 Essential (primary) hypertension: Secondary | ICD-10-CM

## 2011-11-13 MED ORDER — LOSARTAN POTASSIUM 100 MG PO TABS: 100.0000 mg | ORAL_TABLET | Freq: Every day | ORAL | Status: DC

## 2011-11-13 NOTE — Assessment & Plan Note (Signed)
Home blood pressure readings are improved.  Increase losartan to 100 mg. Patient advised to call with home BP readings in 3 weeks.

## 2011-11-13 NOTE — Progress Notes (Signed)
Subjective:    Patient ID: Sean Myers, male    DOB: [birth year], 66 y.o.   MRN: [id number]  HPI  66 year old African [place] male for followup regarding hypertension. At previous visit he was started on losartan 50 mg once daily. He is tolerating this well without any side effects. In fact, he has noticed a nagging cough has completely resolved since stopping ACE inhibitor. Home blood pressure readings are usually 140s systolic and 100 diastolic.  Good medication compliance.  Review of Systems Negative for chest pain or shortness of breath  Past Medical History  Diagnosis Date  . ED (erectile dysfunction)   . Depression   . Hyperlipidemia   . History of cerebrovascular accident   . Diabetes mellitus, type II     History   Social History  . Marital Status: Married    Spouse Name: N/A    Number of Children: N/A  . Years of Education: N/A   Occupational History  . Not on file.   Social History Main Topics  . Smoking status: Current Everyday Smoker -- 1.0 packs/day  . Smokeless tobacco: Not on file  . Alcohol Use: No  . Drug Use: Yes    Special: Marijuana     marijuana  . Sexually Active: Not on file   Other Topics Concern  . Not on file   Social History Narrative   RetiredMarriedCurrent Smoker Alcohol use-no      son Lavone Neri living in [place] ([place]) -  engaged to be married    Past Surgical History  Procedure Date  . Colonoscopy 1990's    [facility]    Family History  Problem Relation Age of Onset  . Heart disease Mother   . Other      no family history of colon cancer    Allergies  Allergen Reactions  . Poliovirus Vaccine Inactivated     Heart races, cold sweats, "almost killed me".  . Aspirin     Gi upset  . Penicillins Swelling, Palpitations and Rash    Current Outpatient Prescriptions on File Prior to Visit  Medication Sig Dispense Refill  . amLODipine (NORVASC) 5 MG tablet Take 5 mg by mouth daily.        Marland Kitchen glucose blood  (ACCU-CHEK AVIVA) test strip Use once a day to check blood sugar       . Lancets (ACCU-CHEK MULTICLIX) lancets Use once a day to check blood sugar       . memantine (NAMENDA) 5 MG tablet Take one tab daily x 1 week, then one twice daily  60 tablet  0  . PLAVIX 75 MG tablet take 1 tablet by mouth once daily  90 tablet  3  . ranitidine (ZANTAC) 150 MG capsule Take 1 capsule (150 mg total) by mouth 2 (two) times daily.  60 capsule  3  . sertraline (ZOLOFT) 50 MG tablet Take 1 tablet (50 mg total) by mouth daily.  90 tablet  1  . sildenafil (VIAGRA) 100 MG tablet Take 100 mg by mouth daily as needed. As needed for erectile dysfunction.      . Tamsulosin HCl (FLOMAX) 0.4 MG CAPS Take 1 capsule (0.4 mg total) by mouth daily after supper.  30 capsule  3    BP 172/104  Pulse 96  Temp(Src) 98 F (36.7 C) (Oral)  Wt 184 lb (83.462 kg)       Objective:   Physical Exam  Constitutional: He is oriented to person, place, and time.  He appears well-developed and well-nourished.  Cardiovascular: Normal rate, regular rhythm and normal heart sounds.   Pulmonary/Chest: Effort normal and breath sounds normal. He has no wheezes.  Musculoskeletal: He exhibits no edema.  Neurological: He is alert and oriented to person, place, and time.  Skin: Skin is warm and dry.  Psychiatric: He has a normal mood and affect. His behavior is normal.      Assessment & Plan:

## 2011-11-13 NOTE — Patient Instructions (Signed)
Please call with your blood pressure readings in 3 weeks

## 2011-12-30 ENCOUNTER — Other Ambulatory Visit: Payer: Self-pay | Admitting: Internal Medicine

## 2012-01-24 ENCOUNTER — Other Ambulatory Visit: Payer: Self-pay | Admitting: Internal Medicine

## 2012-06-03 ENCOUNTER — Encounter: Payer: Self-pay | Admitting: Internal Medicine

## 2012-06-03 ENCOUNTER — Ambulatory Visit (INDEPENDENT_AMBULATORY_CARE_PROVIDER_SITE_OTHER): Payer: BC Managed Care – PPO | Admitting: Internal Medicine

## 2012-06-03 VITALS — BP 182/102 | HR 76 | Temp 98.1°F | Wt 183.0 lb

## 2012-06-03 DIAGNOSIS — I10 Essential (primary) hypertension: Secondary | ICD-10-CM

## 2012-06-03 DIAGNOSIS — R7309 Other abnormal glucose: Secondary | ICD-10-CM

## 2012-06-03 DIAGNOSIS — Z8679 Personal history of other diseases of the circulatory system: Secondary | ICD-10-CM

## 2012-06-03 MED ORDER — CLOPIDOGREL BISULFATE 75 MG PO TABS: 75.0000 mg | ORAL_TABLET | Freq: Every day | ORAL | Status: DC

## 2012-06-03 MED ORDER — AMLODIPINE BESYLATE 5 MG PO TABS: 5.0000 mg | ORAL_TABLET | Freq: Every day | ORAL | Status: DC

## 2012-06-03 MED ORDER — LOSARTAN POTASSIUM 100 MG PO TABS: 100.0000 mg | ORAL_TABLET | Freq: Every day | ORAL | Status: DC

## 2012-06-03 NOTE — Assessment & Plan Note (Signed)
Restart Plavix

## 2012-06-03 NOTE — Assessment & Plan Note (Signed)
Poor blood pressure control secondary to medication noncompliance. We discussed at length risk of repeat CVA with uncontrolled hypertension. Patient strongly urged to restart his medications and take them regularly.

## 2012-06-03 NOTE — Patient Instructions (Addendum)
Please complete the following lab tests before your next follow up appointment: BMET - 401.9 FLP, LFTs, TSH - 272.4 

## 2012-06-03 NOTE — Assessment & Plan Note (Signed)
Monitor A1c 

## 2012-06-03 NOTE — Progress Notes (Signed)
Subjective:    Patient ID: Sean Myers, male    DOB: 07/20/1946, 66 y.o.   MRN: [id number]  HPI  66 year old African American male with history of hypertension, previous CVA and borderline type 2 diabetes for followup. Patient has been noncompliant with all his medications.  Patient denies any issues with adverse effect. He has made some dietary changes and thought his blood pressure and borderline diabetes would be controlled with dietary changes alone.  He continues to smoke despite extensive counseling and urging from his wife.  Review of Systems Negative for chest pain   Past Medical History  Diagnosis Date  . ED (erectile dysfunction)   . Depression   . Hyperlipidemia   . History of cerebrovascular accident   . Diabetes mellitus, type II     History   Social History  . Marital Status: Married    Spouse Name: N/A    Number of Children: N/A  . Years of Education: N/A   Occupational History  . Not on file.   Social History Main Topics  . Smoking status: Current Every Day Smoker -- 1.0 packs/day  . Smokeless tobacco: Not on file  . Alcohol Use: No  . Drug Use: Yes    Special: Marijuana     marijuana  . Sexually Active: Not on file   Other Topics Concern  . Not on file   Social History Narrative   RetiredMarriedCurrent Smoker Alcohol use-no      son Lavone Neri living in [place] ([place]) -  engaged to be married    Past Surgical History  Procedure Date  . Colonoscopy 1990's    [facility]    Family History  Problem Relation Age of Onset  . Heart disease Mother   . Other      no family history of colon cancer    Allergies  Allergen Reactions  . Poliovirus Vaccine Inactivated     Heart races, cold sweats, "almost killed me".  . Aspirin     Gi upset  . Penicillins Swelling, Palpitations and Rash    Current Outpatient Prescriptions on File Prior to Visit  Medication Sig Dispense Refill  . glucose blood (ACCU-CHEK AVIVA) test strip Use once a  day to check blood sugar       . Lancets (ACCU-CHEK MULTICLIX) lancets Use once a day to check blood sugar       . memantine (NAMENDA) 5 MG tablet Take one tab daily x 1 week, then one twice daily  60 tablet  0  . VIAGRA 100 MG tablet take as directed if needed  10 tablet  5  . DISCONTD: amLODipine (NORVASC) 5 MG tablet take 1 tablet by mouth once daily  90 tablet  3  . DISCONTD: losartan (COZAAR) 100 MG tablet Take 1 tablet (100 mg total) by mouth daily.  90 tablet  0    BP 182/102  Pulse 76  Temp 98.1 F (36.7 C) (Oral)  Wt 183 lb (83.008 kg)       Objective:   Physical Exam  Constitutional: He is oriented to person, place, and time. He appears well-developed and well-nourished.  Cardiovascular: Normal rate, regular rhythm and normal heart sounds.   Pulmonary/Chest: Effort normal and breath sounds normal. He has no wheezes.  Abdominal: Soft. Bowel sounds are normal.  Neurological: He is alert and oriented to person, place, and time. No cranial nerve deficit.  Psychiatric: He has a normal mood and affect.  Assessment & Plan:

## 2012-06-10 ENCOUNTER — Other Ambulatory Visit (INDEPENDENT_AMBULATORY_CARE_PROVIDER_SITE_OTHER): Payer: BC Managed Care – PPO

## 2012-06-10 DIAGNOSIS — I10 Essential (primary) hypertension: Secondary | ICD-10-CM

## 2012-06-10 DIAGNOSIS — E785 Hyperlipidemia, unspecified: Secondary | ICD-10-CM

## 2012-06-10 LAB — HEPATIC FUNCTION PANEL
Bilirubin, Direct: 0.2 mg/dL (ref 0.0–0.3)
Total Bilirubin: 0.8 mg/dL (ref 0.3–1.2)

## 2012-06-10 LAB — BASIC METABOLIC PANEL
BUN: 9 mg/dL (ref 6–23)
CO2: 23 mEq/L (ref 19–32)
Chloride: 109 mEq/L (ref 96–112)
Glucose, Bld: 94 mg/dL (ref 70–99)
Potassium: 4 mEq/L (ref 3.5–5.1)

## 2012-06-10 LAB — LIPID PANEL
LDL Cholesterol: 113 mg/dL — ABNORMAL HIGH (ref 0–99)
VLDL: 20.4 mg/dL (ref 0.0–40.0)

## 2012-06-17 ENCOUNTER — Ambulatory Visit (INDEPENDENT_AMBULATORY_CARE_PROVIDER_SITE_OTHER): Payer: BC Managed Care – PPO | Admitting: Internal Medicine

## 2012-06-17 ENCOUNTER — Encounter: Payer: Self-pay | Admitting: Internal Medicine

## 2012-06-17 VITALS — BP 150/92 | HR 76 | Temp 98.2°F | Wt 187.0 lb

## 2012-06-17 DIAGNOSIS — E785 Hyperlipidemia, unspecified: Secondary | ICD-10-CM | POA: Insufficient documentation

## 2012-06-17 DIAGNOSIS — I10 Essential (primary) hypertension: Secondary | ICD-10-CM

## 2012-06-17 MED ORDER — AMLODIPINE BESYLATE 10 MG PO TABS: 10.0000 mg | ORAL_TABLET | Freq: Every day | ORAL | Status: DC

## 2012-06-17 MED ORDER — PRAVASTATIN SODIUM 20 MG PO TABS: 20.0000 mg | ORAL_TABLET | Freq: Every day | ORAL | Status: DC

## 2012-06-17 NOTE — Progress Notes (Signed)
Subjective:    Patient ID: Sean Myers, male    DOB: June 09, 1946, 66 y.o.   MRN: [id number]  HPI  66 year old African American male with hypertension, borderline type 2 diabetes and history of stroke for followup. Since previous visit, his medication compliance has significantly improved. His wife is helping with medication administration. He is tolerating losartan, amlodipine and Plavix without difficulty.  Blood work was reviewed. He has stable electrolytes and kidney function. His lipid panel is suboptimal.  Review of Systems Negative for chest pain or shortness of breath  Past Medical History  Diagnosis Date  . ED (erectile dysfunction)   . Depression   . Hyperlipidemia   . History of cerebrovascular accident   . Diabetes mellitus, type II     History   Social History  . Marital Status: Married    Spouse Name: N/A    Number of Children: N/A  . Years of Education: N/A   Occupational History  . Not on file.   Social History Main Topics  . Smoking status: Current Every Day Smoker -- 1.0 packs/day  . Smokeless tobacco: Not on file  . Alcohol Use: No  . Drug Use: Yes    Special: Marijuana     marijuana  . Sexually Active: Not on file   Other Topics Concern  . Not on file   Social History Narrative   RetiredMarriedCurrent Smoker Alcohol use-no      son Lavone Neri living in [place] ([place]) -  engaged to be married    Past Surgical History  Procedure Date  . Colonoscopy 1990's    [facility]    Family History  Problem Relation Age of Onset  . Heart disease Mother   . Other      no family history of colon cancer    Allergies  Allergen Reactions  . Poliovirus Vaccine Inactivated     Heart races, cold sweats, "almost killed me".  . Aspirin     Gi upset  . Penicillins Swelling, Palpitations and Rash    Current Outpatient Prescriptions on File Prior to Visit  Medication Sig Dispense Refill  . clopidogrel (PLAVIX) 75 MG tablet Take 1 tablet (75  mg total) by mouth daily.  90 tablet  1  . glucose blood (ACCU-CHEK AVIVA) test strip Use once a day to check blood sugar       . Lancets (ACCU-CHEK MULTICLIX) lancets Use once a day to check blood sugar       . losartan (COZAAR) 100 MG tablet Take 1 tablet (100 mg total) by mouth daily.  90 tablet  1  . memantine (NAMENDA) 5 MG tablet Take one tab daily x 1 week, then one twice daily  60 tablet  0  . VIAGRA 100 MG tablet take as directed if needed  10 tablet  5  . DISCONTD: amLODipine (NORVASC) 5 MG tablet Take 1 tablet (5 mg total) by mouth daily.  90 tablet  1  . pravastatin (PRAVACHOL) 20 MG tablet Take 1 tablet (20 mg total) by mouth daily.  90 tablet  3    BP 150/92  Pulse 76  Temp 98.2 F (36.8 C) (Oral)  Wt 187 lb (84.823 kg)       Objective:   Physical Exam  Constitutional: He is oriented to person, place, and time. He appears well-developed and well-nourished. No distress.  Cardiovascular: Normal rate and normal heart sounds.   Pulmonary/Chest: Effort normal and breath sounds normal. He has no wheezes.  Musculoskeletal: He exhibits no edema.  Neurological: He is alert and oriented to person, place, and time. No cranial nerve deficit.  Skin: Skin is warm and dry.  Psychiatric: He has a normal mood and affect. His behavior is normal.          Assessment & Plan:

## 2012-06-17 NOTE — Assessment & Plan Note (Signed)
Blood pressure improved but still not at goal. Increase amlodipine 10 mg. BP: 150/92 mmHg  Lab Results  Component Value Date   CREATININE 0.9 06/10/2012

## 2012-06-17 NOTE — Patient Instructions (Addendum)
Contact our office via MyChart re: blood pressure monthly until next office visit Please complete the following lab tests before your next follow up appointment: BMET - 401.9 LFTs, FLP - 272.4

## 2012-06-17 NOTE — Assessment & Plan Note (Signed)
Start pravastatin 20 mg once daily. Repeat fasting lipid panel and LFTs in 3 months.

## 2012-09-10 ENCOUNTER — Other Ambulatory Visit: Payer: BC Managed Care – PPO

## 2012-09-11 ENCOUNTER — Other Ambulatory Visit (INDEPENDENT_AMBULATORY_CARE_PROVIDER_SITE_OTHER): Payer: Medicare Other

## 2012-09-11 DIAGNOSIS — E785 Hyperlipidemia, unspecified: Secondary | ICD-10-CM

## 2012-09-11 DIAGNOSIS — I10 Essential (primary) hypertension: Secondary | ICD-10-CM

## 2012-09-11 LAB — LIPID PANEL
HDL: 33.8 mg/dL — ABNORMAL LOW (ref >39.00)
Total CHOL/HDL Ratio: 5
VLDL: 19.6 mg/dL (ref 0.0–40.0)

## 2012-09-11 LAB — BASIC METABOLIC PANEL
CO2: 27 mEq/L (ref 19–32)
GFR: 87.62 mL/min (ref >60.00)
Glucose, Bld: 97 mg/dL (ref 70–99)
Potassium: 3.8 mEq/L (ref 3.5–5.1)
Sodium: 140 mEq/L (ref 135–145)

## 2012-09-11 LAB — HEPATIC FUNCTION PANEL
ALT: 9 U/L (ref 0–53)
Albumin: 3.7 g/dL (ref 3.5–5.2)
Total Bilirubin: 0.3 mg/dL (ref 0.3–1.2)

## 2012-09-17 ENCOUNTER — Ambulatory Visit: Payer: BC Managed Care – PPO | Admitting: Internal Medicine

## 2012-09-18 ENCOUNTER — Encounter: Payer: Self-pay | Admitting: Internal Medicine

## 2012-09-18 ENCOUNTER — Ambulatory Visit (INDEPENDENT_AMBULATORY_CARE_PROVIDER_SITE_OTHER): Payer: BC Managed Care – PPO | Admitting: Internal Medicine

## 2012-09-18 VITALS — BP 162/100 | HR 72 | Temp 98.0°F | Wt 190.0 lb

## 2012-09-18 DIAGNOSIS — R413 Other amnesia: Secondary | ICD-10-CM

## 2012-09-18 DIAGNOSIS — I10 Essential (primary) hypertension: Secondary | ICD-10-CM

## 2012-09-18 DIAGNOSIS — Z8679 Personal history of other diseases of the circulatory system: Secondary | ICD-10-CM

## 2012-09-18 MED ORDER — MEMANTINE HCL 10 MG PO TABS: 10.0000 mg | ORAL_TABLET | Freq: Two times a day (BID) | ORAL | Status: DC

## 2012-09-18 MED ORDER — VALSARTAN 160 MG PO TABS: 160.0000 mg | ORAL_TABLET | Freq: Every day | ORAL | Status: DC

## 2012-09-18 MED ORDER — CLOPIDOGREL BISULFATE 75 MG PO TABS: 75.0000 mg | ORAL_TABLET | Freq: Every day | ORAL | Status: DC

## 2012-09-18 NOTE — Assessment & Plan Note (Signed)
Blood pressure still not at goal. Changed to losartan to valsartan 160 mg. Continue amlodipine 10 mg. BP: 162/100 mmHg  Lab Results  Component Value Date   CREATININE 1.1 09/11/2012

## 2012-09-18 NOTE — Assessment & Plan Note (Signed)
Continue Plavix 75 mg once daily. Smoking status and strongly encouraged.

## 2012-09-18 NOTE — Progress Notes (Signed)
Subjective:    Patient ID: Sean Myers, male    DOB: 09-25-1945, 67 y.o.   MRN: [id number]  HPI  67 year old African American male with history of stroke, hypertension and hyperlipidemia for routine followup. At previous visit patient started on Namenda for memory loss. Wife reports has noticed mild improvement and his irritability is better.  He denies any adverse effects.  Hypertension reports good compliance with valsartan and amlodipine. Hematochezia monitor his blood pressure at home. Systolic blood pressure readings usually 130 or 140.Marland Kitchen    Review of Systems Intermittent shortness of breath, he continues to smoke  Past Medical History  Diagnosis Date  . ED (erectile dysfunction)   . Depression   . Hyperlipidemia   . History of cerebrovascular accident   . Diabetes mellitus, type II     History   Social History  . Marital Status: Married    Spouse Name: N/A    Number of Children: N/A  . Years of Education: N/A   Occupational History  . Not on file.   Social History Main Topics  . Smoking status: Current Every Day Smoker -- 1.0 packs/day  . Smokeless tobacco: Not on file  . Alcohol Use: No  . Drug Use: Yes    Special: Marijuana     Comment: marijuana  . Sexually Active: Not on file   Other Topics Concern  . Not on file   Social History Narrative   RetiredMarriedCurrent Smoker Alcohol use-no      son Lavone Neri living in [place] ([place]) -  engaged to be married    Past Surgical History  Procedure Date  . Colonoscopy 1990's    [facility] P[facility]    Family History  Problem Relation Age of Onset  . Heart disease Mother   . Other      no family history of colon cancer    Allergies  Allergen Reactions  . Poliovirus Vaccine Inactivated     Heart races, cold sweats, "almost killed me".  . Aspirin     Gi upset  . Penicillins Swelling, Palpitations and Rash    Current Outpatient Prescriptions on File Prior to Visit  Medication Sig Dispense Refill    . amLODipine (NORVASC) 10 MG tablet Take 1 tablet (10 mg total) by mouth daily.  90 tablet  1  . glucose blood (ACCU-CHEK AVIVA) test strip Use once a day to check blood sugar       . Lancets (ACCU-CHEK MULTICLIX) lancets Use once a day to check blood sugar       . memantine (NAMENDA) 10 MG tablet Take 1 tablet (10 mg total) by mouth 2 (two) times daily.  60 tablet  5  . pravastatin (PRAVACHOL) 20 MG tablet Take 1 tablet (20 mg total) by mouth daily.  90 tablet  3  . VIAGRA 100 MG tablet take as directed if needed  10 tablet  5  . valsartan (DIOVAN) 160 MG tablet Take 1 tablet (160 mg total) by mouth daily.  90 tablet  1    BP 162/100  Pulse 72  Temp 98 F (36.7 C) (Oral)  Wt 190 lb (86.183 kg)       Objective:   Physical Exam  Constitutional: He appears well-developed and well-nourished.  Cardiovascular: Normal rate, regular rhythm and normal heart sounds.   No murmur heard. Pulmonary/Chest: Effort normal and breath sounds normal. He has no wheezes.  Skin: Skin is warm and dry.  Psychiatric: He has a normal mood and affect.  His behavior is normal.          Assessment & Plan:

## 2012-09-18 NOTE — Assessment & Plan Note (Signed)
Mild improvement with starting Namenda. Increase dose to 10 mg twice daily.

## 2012-10-12 ENCOUNTER — Encounter: Payer: Self-pay | Admitting: Internal Medicine

## 2012-11-16 ENCOUNTER — Encounter: Payer: Self-pay | Admitting: Internal Medicine

## 2012-11-16 ENCOUNTER — Ambulatory Visit (INDEPENDENT_AMBULATORY_CARE_PROVIDER_SITE_OTHER): Payer: BC Managed Care – PPO | Admitting: Internal Medicine

## 2012-11-16 VITALS — BP 134/80 | HR 74 | Temp 98.0°F | Wt 180.0 lb

## 2012-11-16 DIAGNOSIS — I10 Essential (primary) hypertension: Secondary | ICD-10-CM

## 2012-11-16 DIAGNOSIS — R413 Other amnesia: Secondary | ICD-10-CM

## 2012-11-16 MED ORDER — VALSARTAN 160 MG PO TABS: 240.0000 mg | ORAL_TABLET | Freq: Every day | ORAL | Status: DC

## 2012-11-16 MED ORDER — PRAVASTATIN SODIUM 20 MG PO TABS: 20.0000 mg | ORAL_TABLET | Freq: Every day | ORAL | Status: DC

## 2012-11-16 MED ORDER — AMLODIPINE BESYLATE 10 MG PO TABS: 10.0000 mg | ORAL_TABLET | Freq: Every day | ORAL | Status: DC

## 2012-11-16 NOTE — Assessment & Plan Note (Signed)
Blood pressure improved but still suboptimal. His blood pressure readings with manual cuff 150/80. Increase valsartan to 240 mg. Continue amlodipine 10 mg. Patient advised to contact her office with blood pressure readings in 2-4 weeks.

## 2012-11-16 NOTE — Assessment & Plan Note (Signed)
Patient tolerating Namenda 10 mg bid.  Less irritability.

## 2012-11-16 NOTE — Progress Notes (Signed)
Subjective:    Patient ID: Sean Myers, male    DOB: 10-19-1945, 67 y.o.   MRN: [id number]  HPI  67 year old African American male with history of hypertension, mild dementia and history of stroke for followup.  Patient has been taking his blood pressure medications as directed. Home blood pressure readings somewhat labile. Occasionally they're in the 160s. Wife reports usual systolic blood pressure readings in the 140s.  Mild dementia-patientTaking Namenda 10 mg twice daily. Wife reports no significant change in memory but slight improvement with less irritability.  Review of Systems Negative for chest pain  Past Medical History  Diagnosis Date  . ED (erectile dysfunction)   . Depression   . Hyperlipidemia   . History of cerebrovascular accident   . Diabetes mellitus, type II     History   Social History  . Marital Status: Married    Spouse Name: N/A    Number of Children: N/A  . Years of Education: N/A   Occupational History  . Not on file.   Social History Main Topics  . Smoking status: Current Every Day Smoker -- 1.00 packs/day  . Smokeless tobacco: Not on file  . Alcohol Use: No  . Drug Use: Yes    Special: Marijuana     Comment: marijuana  . Sexually Active: Not on file   Other Topics Concern  . Not on file   Social History Narrative   Retired   Married   Current Smoker    Alcohol use-no         son Lavone Neri living in [place] ([place]) -  engaged to be married    Past Surgical History  Procedure Laterality Date  . Colonoscopy  1990's    [facility]    Family History  Problem Relation Age of Onset  . Heart disease Mother   . Other      no family history of colon cancer    Allergies  Allergen Reactions  . Poliovirus Vaccine Inactivated     Heart races, cold sweats, "almost killed me".  . Aspirin     Gi upset  . Penicillins Swelling, Palpitations and Rash    Current Outpatient Prescriptions on File Prior to Visit  Medication Sig  Dispense Refill  . clopidogrel (PLAVIX) 75 MG tablet Take 1 tablet (75 mg total) by mouth daily.  90 tablet  1  . glucose blood (ACCU-CHEK AVIVA) test strip Use once a day to check blood sugar       . Lancets (ACCU-CHEK MULTICLIX) lancets Use once a day to check blood sugar       . memantine (NAMENDA) 10 MG tablet Take 1 tablet (10 mg total) by mouth 2 (two) times daily.  60 tablet  5  . VIAGRA 100 MG tablet take as directed if needed  10 tablet  5   No current facility-administered medications on file prior to visit.    BP 134/80  Pulse 74  Temp(Src) 98 F (36.7 C) (Oral)  Wt 180 lb (81.647 kg)  BMI 25.12 kg/m2       Objective:   Physical Exam  Constitutional: He is oriented to person, place, and time. He appears well-developed and well-nourished.  Cardiovascular: Normal rate, regular rhythm and normal heart sounds.   Pulmonary/Chest: Effort normal and breath sounds normal. He has no wheezes.  Neurological: He is alert and oriented to person, place, and time. No cranial nerve deficit.  Psychiatric: He has a normal mood and affect. His behavior  is normal.          Assessment & Plan:

## 2012-11-16 NOTE — Patient Instructions (Signed)
Please complete the following lab tests before your next follow up appointment: BMET - 401.9 

## 2012-12-28 ENCOUNTER — Encounter: Payer: Self-pay | Admitting: Internal Medicine

## 2012-12-29 ENCOUNTER — Ambulatory Visit (INDEPENDENT_AMBULATORY_CARE_PROVIDER_SITE_OTHER): Payer: Medicare Other | Admitting: Internal Medicine

## 2012-12-29 ENCOUNTER — Emergency Department (HOSPITAL_COMMUNITY): Payer: Medicare Other

## 2012-12-29 ENCOUNTER — Inpatient Hospital Stay (HOSPITAL_COMMUNITY)
Admission: EM | Admit: 2012-12-29 | Discharge: 2013-01-01 | DRG: 065 | Disposition: A | Discharge status: Home / Self Care | Payer: Medicare Other | Attending: Internal Medicine | Admitting: Internal Medicine

## 2012-12-29 ENCOUNTER — Encounter: Payer: Self-pay | Admitting: Internal Medicine

## 2012-12-29 ENCOUNTER — Encounter (HOSPITAL_COMMUNITY): Payer: Self-pay | Admitting: *Deleted

## 2012-12-29 VITALS — BP 184/122 | Temp 98.0°F

## 2012-12-29 DIAGNOSIS — R279 Unspecified lack of coordination: Secondary | ICD-10-CM | POA: Diagnosis present

## 2012-12-29 DIAGNOSIS — R404 Transient alteration of awareness: Secondary | ICD-10-CM | POA: Diagnosis not present

## 2012-12-29 DIAGNOSIS — R269 Unspecified abnormalities of gait and mobility: Secondary | ICD-10-CM

## 2012-12-29 DIAGNOSIS — I4460 Unspecified fascicular block: Secondary | ICD-10-CM | POA: Diagnosis not present

## 2012-12-29 DIAGNOSIS — I639 Cerebral infarction, unspecified: Secondary | ICD-10-CM | POA: Diagnosis present

## 2012-12-29 DIAGNOSIS — E785 Hyperlipidemia, unspecified: Secondary | ICD-10-CM | POA: Diagnosis not present

## 2012-12-29 DIAGNOSIS — F329 Major depressive disorder, single episode, unspecified: Secondary | ICD-10-CM | POA: Diagnosis present

## 2012-12-29 DIAGNOSIS — R1110 Vomiting, unspecified: Secondary | ICD-10-CM

## 2012-12-29 DIAGNOSIS — R4789 Other speech disturbances: Secondary | ICD-10-CM | POA: Diagnosis present

## 2012-12-29 DIAGNOSIS — G458 Other transient cerebral ischemic attacks and related syndromes: Secondary | ICD-10-CM | POA: Diagnosis not present

## 2012-12-29 DIAGNOSIS — R270 Ataxia, unspecified: Secondary | ICD-10-CM

## 2012-12-29 DIAGNOSIS — I6350 Cerebral infarction due to unspecified occlusion or stenosis of unspecified cerebral artery: Principal | ICD-10-CM | POA: Diagnosis present

## 2012-12-29 DIAGNOSIS — I10 Essential (primary) hypertension: Secondary | ICD-10-CM | POA: Diagnosis not present

## 2012-12-29 DIAGNOSIS — Z79899 Other long term (current) drug therapy: Secondary | ICD-10-CM

## 2012-12-29 DIAGNOSIS — I519 Heart disease, unspecified: Secondary | ICD-10-CM | POA: Diagnosis present

## 2012-12-29 DIAGNOSIS — Q2111 Secundum atrial septal defect: Secondary | ICD-10-CM

## 2012-12-29 DIAGNOSIS — R410 Disorientation, unspecified: Secondary | ICD-10-CM

## 2012-12-29 DIAGNOSIS — R972 Elevated prostate specific antigen [PSA]: Secondary | ICD-10-CM | POA: Diagnosis present

## 2012-12-29 DIAGNOSIS — E1142 Type 2 diabetes mellitus with diabetic polyneuropathy: Secondary | ICD-10-CM | POA: Diagnosis present

## 2012-12-29 DIAGNOSIS — R112 Nausea with vomiting, unspecified: Secondary | ICD-10-CM | POA: Diagnosis not present

## 2012-12-29 DIAGNOSIS — R29810 Facial weakness: Secondary | ICD-10-CM | POA: Diagnosis present

## 2012-12-29 DIAGNOSIS — E1149 Type 2 diabetes mellitus with other diabetic neurological complication: Secondary | ICD-10-CM | POA: Diagnosis present

## 2012-12-29 DIAGNOSIS — F3289 Other specified depressive episodes: Secondary | ICD-10-CM | POA: Diagnosis present

## 2012-12-29 DIAGNOSIS — Z91199 Patient's noncompliance with other medical treatment and regimen due to unspecified reason: Secondary | ICD-10-CM

## 2012-12-29 DIAGNOSIS — R748 Abnormal levels of other serum enzymes: Secondary | ICD-10-CM | POA: Diagnosis present

## 2012-12-29 DIAGNOSIS — F17200 Nicotine dependence, unspecified, uncomplicated: Secondary | ICD-10-CM

## 2012-12-29 DIAGNOSIS — Q211 Atrial septal defect: Secondary | ICD-10-CM

## 2012-12-29 DIAGNOSIS — Z8711 Personal history of peptic ulcer disease: Secondary | ICD-10-CM

## 2012-12-29 DIAGNOSIS — Z7902 Long term (current) use of antithrombotics/antiplatelets: Secondary | ICD-10-CM

## 2012-12-29 DIAGNOSIS — Z9119 Patient's noncompliance with other medical treatment and regimen: Secondary | ICD-10-CM | POA: Diagnosis not present

## 2012-12-29 DIAGNOSIS — Z8673 Personal history of transient ischemic attack (TIA), and cerebral infarction without residual deficits: Secondary | ICD-10-CM

## 2012-12-29 DIAGNOSIS — G9389 Other specified disorders of brain: Secondary | ICD-10-CM | POA: Diagnosis not present

## 2012-12-29 HISTORY — DX: Cerebral infarction, unspecified: I63.9

## 2012-12-29 LAB — CBC
MCH: 28.4 pg (ref 26.0–34.0)
MCH: 28.7 pg (ref 26.0–34.0)
MCV: 78.9 fL (ref 78.0–100.0)
MCV: 78.5 fL (ref 78.0–100.0)
Platelets: 214 10*3/uL (ref 150–400)
Platelets: 194 10*3/uL (ref 150–400)
RBC: 6.73 MIL/uL — ABNORMAL HIGH (ref 4.22–5.81)
RDW: 14.6 % (ref 11.5–15.5)

## 2012-12-29 LAB — PROTIME-INR: Prothrombin Time: 13.6 seconds (ref 11.6–15.2)

## 2012-12-29 LAB — GLUCOSE, CAPILLARY: Glucose-Capillary: 132 mg/dL — ABNORMAL HIGH (ref 70–99)

## 2012-12-29 LAB — COMPREHENSIVE METABOLIC PANEL
BUN: 13 mg/dL (ref 6–23)
Calcium: 10.5 mg/dL (ref 8.4–10.5)
Creatinine, Ser: 0.91 mg/dL (ref 0.50–1.35)
GFR calc Af Amer: >90 mL/min (ref >90)
Glucose, Bld: 124 mg/dL — ABNORMAL HIGH (ref 70–99)
Sodium: 143 mEq/L (ref 135–145)
Total Protein: 9.3 g/dL — ABNORMAL HIGH (ref 6.0–8.3)

## 2012-12-29 LAB — DIFFERENTIAL
Eosinophils Absolute: 0 10*3/uL (ref 0.0–0.7)
Eosinophils Relative: 0 % (ref 0–5)
Lymphs Abs: 2.4 10*3/uL (ref 0.7–4.0)
Monocytes Absolute: 0.4 10*3/uL (ref 0.1–1.0)
Monocytes Relative: 6 % (ref 3–12)

## 2012-12-29 LAB — CREATININE, SERUM: Creatinine, Ser: 1.03 mg/dL (ref 0.50–1.35)

## 2012-12-29 LAB — TROPONIN I: Troponin I: 0.86 ng/mL (ref <0.30)

## 2012-12-29 MED ORDER — HEPARIN SODIUM (PORCINE) 5000 UNIT/ML IJ SOLN
5000.0000 [IU] | Freq: Three times a day (TID) | INTRAMUSCULAR | Status: DC
  Administered 2012-12-29 – 2012-12-31 (×6): 5000 [IU] via SUBCUTANEOUS
  Filled 2012-12-29 (×11): qty 1

## 2012-12-29 MED ORDER — ASPIRIN 81 MG PO CHEW
324.0000 mg | CHEWABLE_TABLET | Freq: Once | ORAL | Status: DC
  Filled 2012-12-29: qty 4

## 2012-12-29 MED ORDER — NICOTINE 14 MG/24HR TD PT24
14.0000 mg | MEDICATED_PATCH | Freq: Every day | TRANSDERMAL | Status: DC
  Administered 2012-12-29 – 2012-12-31 (×3): 14 mg via TRANSDERMAL
  Filled 2012-12-29 (×4): qty 1

## 2012-12-29 MED ORDER — AMLODIPINE BESYLATE 10 MG PO TABS: 10.0000 mg | ORAL_TABLET | Freq: Every day | ORAL | Status: DC

## 2012-12-29 MED ORDER — ONDANSETRON HCL 4 MG PO TABS: 4.0000 mg | ORAL_TABLET | Freq: Three times a day (TID) | ORAL | Status: DC | PRN

## 2012-12-29 MED ORDER — CLOPIDOGREL BISULFATE 75 MG PO TABS
75.0000 mg | ORAL_TABLET | Freq: Every day | ORAL | Status: DC
  Administered 2012-12-29 – 2012-12-31 (×3): 75 mg via ORAL
  Filled 2012-12-29: qty 1

## 2012-12-29 NOTE — ED Notes (Signed)
Dr stewart at the bedside 

## 2012-12-29 NOTE — ED Notes (Signed)
Aspirin ordered but the pt is allergic to aspirin not gven

## 2012-12-29 NOTE — ED Provider Notes (Signed)
tPA in stroke considered but not given due to:  Onset over 3-4.5hours   Pt here for ataxia and vomiting.  Suspect cerebellar infarct and CT imaging concerning for cerebellar infarct Advised pt need for admission    Joya Gaskins, MD 12/29/12 1726

## 2012-12-29 NOTE — ED Provider Notes (Signed)
History     CSN: [id number]  Arrival date & time 12/29/12  1432   First MD Initiated Contact with Patient 12/29/12 1542      Chief Complaint  Patient presents with  . Gait Problem  . Emesis    (Consider location/radiation/quality/duration/timing/severity/associated sxs/prior treatment) HPI Comments: 45 y M smoker with PMH of T2DM, HLD, HTN and prior CVA (visul fields deficits) here with unsteadiness on his feet, nausea, vomiting x5 since yesterday - NB/NB, mild achy left sided HA and slurred.  Symptoms began yesterday at approx 2p with vomiting. He then laid down and took a nap.  His daughter noticed that he had slurred speech and was unsteady on his feet.  Sean Myers endorses left side weakness in addition to his other sx.  No fevers, CP, SOB, abd pain, photophobia, paresthesias, falls, recent head trauma.  Pt takes Plavix, amlodipine, losartan.  Patient is a 67 y.o. male presenting with neurologic complaint. The history is provided by the patient and a relative.  Neurologic Problem The primary symptoms include headaches, focal weakness (left sided), speech change (slurred speech), nausea and vomiting (NB/NB, x5 since yesterday). Primary symptoms do not include loss of consciousness, dizziness, visual change, paresthesias, loss of sensation or fever. The symptoms began yesterday (2p). The symptoms are improving. The neurological symptoms are focal.  The headache is associated with loss of balance. The headache is not associated with aura, photophobia, visual change, neck stiffness or paresthesias.  Additional symptoms include loss of balance. Additional symptoms do not include neck stiffness, pain, lower back pain, photophobia, aura, hallucinations, hearing loss or tinnitus. Medical issues also include cerebral vascular accident, diabetes and hypertension.    Past Medical History  Diagnosis Date  . ED (erectile dysfunction)   . Depression   . Hyperlipidemia   . History of cerebrovascular  accident   . Diabetes mellitus, type II     Past Surgical History  Procedure Laterality Date  . Colonoscopy  1990's    Sean Myers    Family History  Problem Relation Age of Onset  . Heart disease Mother   . Other      no family history of colon cancer    History  Substance Use Topics  . Smoking status: Current Every Day Smoker -- 1.00 packs/day  . Smokeless tobacco: Not on file  . Alcohol Use: No      Review of Systems  Constitutional: Negative for fever.  HENT: Negative for hearing loss, neck stiffness and tinnitus.   Eyes: Negative for photophobia.  Gastrointestinal: Positive for nausea and vomiting (NB/NB, x5 since yesterday).  Neurological: Positive for speech change (slurred speech), focal weakness (left sided), headaches and loss of balance. Negative for dizziness, loss of consciousness and paresthesias.  Psychiatric/Behavioral: Negative for hallucinations.  All other systems reviewed and are negative.    Allergies  Poliovirus vaccine inactivated; Aspirin; and Penicillins  Home Medications   Current Outpatient Rx  Name  Route  Sig  Dispense  Refill  . amLODipine (NORVASC) 10 MG tablet   Oral   Take 1 tablet (10 mg total) by mouth daily.   90 tablet   1   . clopidogrel (PLAVIX) 75 MG tablet   Oral   Take 1 tablet (75 mg total) by mouth daily.   90 tablet   1   . glucose blood (ACCU-CHEK AVIVA) test strip      Use once a day to check blood sugar          .  Lancets (ACCU-CHEK MULTICLIX) lancets      Use once a day to check blood sugar          . memantine (NAMENDA) 10 MG tablet   Oral   Take 1 tablet (10 mg total) by mouth 2 (two) times daily.   60 tablet   5   . ondansetron (ZOFRAN) 4 MG tablet   Oral   Take 1 tablet (4 mg total) by mouth every 8 (eight) hours as needed for nausea.   30 tablet   0   . pravastatin (PRAVACHOL) 20 MG tablet   Oral   Take 1 tablet (20 mg total) by mouth daily.   90 tablet   1   . valsartan  (DIOVAN) 160 MG tablet   Oral   Take 1.5 tablets (240 mg total) by mouth daily.   135 tablet   1   . VIAGRA 100 MG tablet      take as directed if needed   10 tablet   5     COULD NOT DETERMINE DRUG CLASS - INVALID NDC     BP 158/108  Pulse 80  Temp(Src) 98.5 F (36.9 C) (Oral)  Resp 16  SpO2 96%  Physical Exam  Vitals reviewed. Constitutional: He is oriented to person, place, and time. He appears well-developed and well-nourished. No distress.  HENT:  Head: Normocephalic.  Right Ear: External ear normal.  Left Ear: External ear normal.  Nose: Nose normal.  Mouth/Throat: Oropharynx is clear and moist. No oropharyngeal exudate.  Eyes: Conjunctivae and EOM are normal. Pupils are equal, round, and reactive to light.  Neck: Normal range of motion. Neck supple.  Cardiovascular: Normal rate, regular rhythm, normal heart sounds and intact distal pulses.  Exam reveals no gallop and no friction rub.   No murmur heard. Pulmonary/Chest: Effort normal and breath sounds normal.  Abdominal: Soft. Bowel sounds are normal. He exhibits no distension. There is no tenderness.  Musculoskeletal: Normal range of motion. He exhibits no edema and no tenderness.  Neurological: He is alert and oriented to person, place, and time. He has normal strength and normal reflexes. No cranial nerve deficit or sensory deficit.  Swaying with Romberg, but no falls F2N and H2S intact but slow No truncal ataxia Unsteady gait Subtle Right sided pronator drift  Skin: Skin is warm and dry.  Psychiatric: He has a normal mood and affect.    ED Course  Procedures (including critical care time)  Labs Reviewed  GLUCOSE, CAPILLARY - Abnormal; Notable for the following:    Glucose-Capillary 132 (*)    All other components within normal limits  PROTIME-INR  APTT  CBC  DIFFERENTIAL  COMPREHENSIVE METABOLIC PANEL  TROPONIN I   Ct Head (brain) Wo Contrast  12/29/2012  *RADIOLOGY REPORT*  Clinical Data:  Lightheaded and unsteady gait.  CT HEAD WITHOUT CONTRAST  Technique:  Contiguous axial images were obtained from the base of the skull through the vertex without contrast.  Comparison: CT head without contrast 07/29/2011.  Findings: Remote encephalomalacia of the right occipital lobe is again noted.  Multiple remote lacunar infarcts are again noted in the basal ganglia bilaterally.  Least one infarct in the right caudate head is new since the prior exam, but does appear remote. Areas of hypoattenuation in the cerebellum bilaterally are also new.  These are age indeterminate and may represent acute infarcts.  No hemorrhage or mass lesion is evident.  The ventricles are of normal size.  No significant extra-axial fluid  collection is present.  The paranasal sinuses and mastoid air cells are clear.  The osseous skull is intact.  IMPRESSION:  1.  Age indeterminate to infarcts of the cerebellum bilaterally may be acute. 2.  The stable encephalomalacia of the right occipital lobe. 3.  A similar appearance of atrophy and multiple basil ganglia lacunar infarcts.  At least one infarct of the right caudate head is new since the prior study, but does not appear acute.   Original Report Authenticated By: Marin Roberts, M.D.    Mri Brain Without Contrast  12/30/2012  *RADIOLOGY REPORT*  Clinical Data:  Episode of vomiting and unsteady gait.  MRI BRAIN WITHOUT CONTRAST MRA HEAD WITHOUT CONTRAST  Technique: Multiplanar, multiecho pulse sequences of the brain and surrounding structures were obtained according to standard protocol without intravenous contrast.  Angiographic images of the head were obtained using MRA technique without contrast.  Comparison: 12/29/2012 CT.  MRI HEAD  Findings:  Acute infarct mid to inferior aspect of the left cerebellum, inferior aspect of the right cerebellum and mid to inferior aspect of the vermis.  There may be tiny blood breakdown products along the peripheral aspect of the left cerebellar  hemorrhage. Local mass effect.  Presently no significant compression of the fourth ventricle.  This will need to be monitored closely.  Remote infarcts involving the basal ganglia, thalamus and corona radiata bilaterally greater on the left.  Remote right occipital lobe infarct.  Small vessel disease type changes.  Global atrophy without hydrocephalus.  No intracranial hemorrhage separate from residual blood breakdown products associated with left caudate/lenticular nucleus remote infarct and questionable tiny hemorrhage along the peripheral aspect of the acute left cerebellar infarct.  No intracranial mass lesion detected on this unenhanced exam.  Paranasal sinus opacification.  Mild exophthalmos.  IMPRESSION: Acute infarct mid to inferior aspect of the left cerebellum, inferior aspect of the right cerebellum and mid to inferior aspect of the vermis.  Please see above for additional findings.  MRA HEAD  Findings: Nonvisualization PICA and AICA bilaterally.  No significant stenosis distal vertebral arteries.  Mild to moderate narrowing proximal to mid basilar artery. Moderate to marked narrowing distal basilar artery.  Prominent narrowing irregularity superior cerebral arteries greater left.  Moderate to marked tandem stenoses right posterior cerebral artery with poor delineation distal branches consistent with the patient's remote infarct.  Mild to moderate narrowing left posterior cerebral artery.  Anterior circulation without medium or large size vessel significant stenosis or occlusion.  Middle cerebral artery moderate to marked branch vessel irregularity with decreased number of visualized branch vessels consistent with remote infarcts.  Mild irregularity A1 segment right anterior cerebral artery and supraclinoid aspect of the internal carotid artery.  No aneurysm noted.  IMPRESSION: Intracranial atherosclerotic type changes more notable involving the posterior circulation as detailed above.  This has been made  a PRA call report utilizing dashboard call feature.   Original Report Authenticated By: Lacy Duverney, M.D.      Date: 12/29/2012  Rate: 73  Rhythm: normal sinus rhythm  QRS Axis: left  Intervals: normal  ST/T Wave abnormalities: normal  Conduction Disutrbances:left anterior fascicular block  Narrative Interpretation:   Old EKG Reviewed: none available   1. Cerebellar stroke, acute   2. Ataxia   3. Unspecified essential hypertension   4. CVA (cerebral infarction)   5, Elevated troponin    MDM   67 y M smoker with PMH of T2DM, HLD, HTN and prior CVA (visul fields deficits) here with unsteadiness  on his feet, nausea, vomiting x5 since yesterday - NB/NB, mild achy left sided HA and slurred.  Symptoms began yesterday at approx 2p with vomiting. He then laid down and took a nap.  His daughter noticed that he had slurred speech and was unsteady on his feet.  Sean Myers endorses left side weakness in addition to his other sx.  No fevers, CP, SOB, abd pain, photophobia, paresthesias, falls, recent head trauma.  Pt takes Plavix, amlodipine, losartan.  AFVSS.  Neuro exam as above.  Concern for posterior CVA.  Pt taken to CT on arrival which showed likely acute cerebellar infarcts.  Neurology consulted.  Stroke labs pending.    7:30 PM Pt to be admitted to Medicine. Trop elevated at 0.88.  EKG unchanged.  324 mg ASA given.  Pt denies CP.  Disposition: Admit  Condition: Stable  Pt seen in conjunction with my attending, Dr. Bebe Shaggy.  Oleh Genin, MD PGY-II [facility] Emergency Medicine Resident   Oleh Genin, MD 12/30/12 [phone]

## 2012-12-29 NOTE — ED Notes (Signed)
Family states that his speech is not as clear as normal but patient's speech is comprehensible and understandable

## 2012-12-29 NOTE — ED Notes (Signed)
Stroke swallow screen completed he passed c/o being hungry

## 2012-12-29 NOTE — Assessment & Plan Note (Signed)
67 year old [place] American male with history of treated stroke, hypertension and ongoing tobacco use presents with acute ataxia, slurred speech and nausea and vomiting. Her symptoms concerning for possible acute cerebellar stroke. Patient's symptoms discussed with ER physician at [facility]. He will need MRI of brain. Use Zofran for nausea.

## 2012-12-29 NOTE — H&P (Addendum)
[facility] History and Physical  Sean Myers ZOX:[id number] DOB: 04-02-1946 DOA: 12/29/2012  Referring physician: Bebe Shaggy Ed-P PCP: Thomos Lemons, DO  Specialists: Neurology  Chief Complaint: CVA  HPI: Sean Myers is a 67 y.o. male who presented to [facility] ed 12/29/12-he state she has poor memory of what has happened but thinks he might have had a couple of episodes of emesis-he started this about 12:30 pm and did this 4 x in one hour.  He had only had a cup of coffee and threw this up.  After about an hour-he then seemed to drift to sleep.  When he awaoke he wa sfound to be walking with an unbalancwed gait and he didn't want to eat or drink-he seemed to still be walking and was unbalanced and speech was not as clear as it had been. PAtient saw Dr. Artist Pais this morning and was  NO seizures no other issues, no CP no SOb, no cough noc old no fever no sputum  Review of Systems: The patient denies fever or chills right ear dark stool tarry stool burning in the urine cough cold blurred vision double vision weakness or a one-time  Past Medical History  Diagnosis Date  . ED (erectile dysfunction)   . Depression   . Hyperlipidemia   . History of cerebrovascular accident   . Diabetes mellitus, type II   Chart review Admission 07/16/04 for Gait instability and visuial issues  Past Surgical History  Procedure Laterality Date  . Colonoscopy  1990's    Marcy Panning   Social History:  reports that he has been smoking.  He does not have any smokeless tobacco history on file. He reports that he uses illicit drugs (Marijuana). He reports that he does not drink alcohol.  Allergies  Allergen Reactions  . Poliovirus Vaccine Inactivated     Heart races, cold sweats, "almost killed me".  . Aspirin     Gi upset  . Penicillins Swelling, Palpitations and Rash    Family History  Problem Relation Age of Onset  . Heart disease Mother   . Other      no family history of colon cancer    Prior to  Admission medications   Medication Sig Start Date End Date Taking? Authorizing Provider  amLODipine (NORVASC) 10 MG tablet Take 1 tablet (10 mg total) by mouth daily. 11/16/12  Yes Doe-Hyun Sherran Needs, DO  clopidogrel (PLAVIX) 75 MG tablet Take 1 tablet (75 mg total) by mouth daily. 09/18/12  Yes Doe-Hyun R Artist Pais, DO  ondansetron (ZOFRAN) 4 MG tablet Take 1 tablet (4 mg total) by mouth every 8 (eight) hours as needed for nausea. 12/29/12  Yes Doe-Hyun Sherran Needs, DO  sildenafil (VIAGRA) 100 MG tablet Take 100 mg by mouth daily as needed for erectile dysfunction.   Yes Historical Provider, MD  glucose blood (ACCU-CHEK AVIVA) test strip Use once a day to check blood sugar     Historical Provider, MD  Lancets (ACCU-CHEK MULTICLIX) lancets Use once a day to check blood sugar     Historical Provider, MD   Physical Exam: Filed Vitals:   12/29/12 1448 12/29/12 1603 12/29/12 1650  BP: 158/108 159/91   Pulse: 80 68   Temp: 98.5 F (36.9 C)  98.5 F (36.9 C)  TempSrc: Oral    Resp: 16 18   SpO2: 96% 98%      General:  Pleasant oriented no apparent  Eyes: Extraocular movements intact, NCAT. She meant by confrontation  ENT: Uvula  midline smile symmetric  Neck: Soft supple  Cardiovascular: S1-S2 no murmur rub or gallop  Respiratory: Clinically clear  Neurologic: Cranial 2 through 12 grossly intact, sensory intact, hour intact 5 out of 5 hip flexors extensors biceps and triceps shoulders trapezius. Reflexes are 2/3 gait not tested no incoordination noted  Labs on Admission:  Basic Metabolic Panel:  Recent Labs Lab 12/29/12 1505  NA 143  K 3.4*  CL 99  CO2 31  GLUCOSE 124*  BUN 13  CREATININE 0.91  CALCIUM 10.5   Liver Function Tests:  Recent Labs Lab 12/29/12 1505  AST 24  ALT 9  ALKPHOS 93  BILITOT 0.8  PROT 9.3*  ALBUMIN 4.5   No results found for this basename: LIPASE, AMYLASE,  in the last 168 hours No results found for this basename: AMMONIA,  in the last 168  hours CBC:  Recent Labs Lab 12/29/12 1505  WBC 7.5  NEUTROABS 4.7  HGB 19.1*  HCT 53.1*  MCV 78.9  PLT 214   Cardiac Enzymes:  Recent Labs Lab 12/29/12 1505  TROPONINI 0.86*    BNP (last 3 results) No results found for this basename: PROBNP,  in the last 8760 hours CBG:  Recent Labs Lab 12/29/12 1538  GLUCAP 132*    Radiological Exams on Admission: Ct Head (brain) Wo Contrast  12/29/2012  *RADIOLOGY REPORT*  Clinical Data: Lightheaded and unsteady gait.  CT HEAD WITHOUT CONTRAST  Technique:  Contiguous axial images were obtained from the base of the skull through the vertex without contrast.  Comparison: CT head without contrast 07/29/2011.  Findings: Remote encephalomalacia of the right occipital lobe is again noted.  Multiple remote lacunar infarcts are again noted in the basal ganglia bilaterally.  Least one infarct in the right caudate head is new since the prior exam, but does appear remote. Areas of hypoattenuation in the cerebellum bilaterally are also new.  These are age indeterminate and may represent acute infarcts.  No hemorrhage or mass lesion is evident.  The ventricles are of normal size.  No significant extra-axial fluid collection is present.  The paranasal sinuses and mastoid air cells are clear.  The osseous skull is intact.  IMPRESSION:  1.  Age indeterminate to infarcts of the cerebellum bilaterally may be acute. 2.  The stable encephalomalacia of the right occipital lobe. 3.  A similar appearance of atrophy and multiple basil ganglia lacunar infarcts.  At least one infarct of the right caudate head is new since the prior study, but does not appear acute.   Original Report Authenticated By: Marin Roberts, M.D.     EKG: Independently reviewed. Sinus rhythm no acute change from prior  Assessment/Plan Active Problems:   TOBACCO ABUSE   HYPERTENSION   DUODENAL ULCER, HX OF   Ataxia   Cerebellar stroke, acute   1. Acute cerebellar infarct  likely-patient admitted by stroke pathway including MRI, carotids, echocardiogram and lipid panel, A1c. Likely will need Plavix as prophylactic medication-TPA not given given as patient outside of the window-structural screen to be performed prior to heart healthy diet-apparently has aspirin allergy 2. Acute vomiting episode-unclear etiology. Monitor-would images this recurs 3. Elevated cardiac enzymes-EKG shows sinus rhythm PR interval 0.08 QRS axis -55, patient has no T wave inversions in lead 3 a precordial transition and looks like it's normal EKG. Although troponins are elevated and 0.4 and then 0.9 range, I'm not sure what to make of this. See #4patient had an elevated PSA in 2012-I. suspect that his  LH: Could be an acute phase reactant from this-noted that he is chest pain-free and there no EKG changes he will be getting an echocardiogram-would consult cardiology if EKG changes are noted 4. Elevated PSA-see above 5. Tobacco/marijuana use-cessation counseling 6. Hypertension-allow permissive hypertension first 24 hours. 7. Hyperlipidemia get lipid panel as above-might need high-intensity statin atorvastatin 40 mg/Crestor 10 mg Neurology has seen   Code Status: Full code (must indicate code status--if unknown or must be presumed, indicate so) Family Communication: Wife at bedside (indicate person spoken with, if applicable, with phone number if by telephone) Disposition Plan: Inpatient telemetry (indicate anticipated LOS)  Time spent: 70  Mahala Menghini [facility] Pager [phone]  If 7PM-7AM, please contact night-coverage [email or web address] Password [email or web address] 12/29/2012, 5:56 PM

## 2012-12-29 NOTE — Assessment & Plan Note (Signed)
His blood pressure is elevated. This may be reactive. I would not aggressively treat his blood pressure until we determine whether he's had acute stroke.

## 2012-12-29 NOTE — ED Notes (Signed)
The pt is a smoker and he has a persistant smokers cough.  His  Fine crackles clear somewhat with coughing

## 2012-12-29 NOTE — ED Notes (Signed)
POCT CBG performed; RN notified 

## 2012-12-29 NOTE — Patient Instructions (Addendum)
Proceed to [facility] emergency room. 

## 2012-12-29 NOTE — ED Notes (Signed)
The pt returned from c-t.  Alert famlly at the bedside

## 2012-12-29 NOTE — Consult Note (Signed)
Referring Physician: Dr. Beverely Pace    Chief Complaint: New-onset unstable gait and coordination difficulty.  HPI: Sean Myers is an 67 y.o. male the history of hypertension, hyperlipidemia and previous stroke in 2006 presenting with onset of unsteady gait with associated nausea and vomiting starting at about 1:30 yesterday. He has not noticed focal weakness. Wife indicated he had dysarthria last night, which has resolved. He has had no swallowing difficulty. CT scan of his head showed age indeterminate bilateral cerebellar infarcts, possibly acute, as well as old right occipital infarction and multiple basal ganglia infarctions. NIH stroke score was 1 for facial asymmetry and possibly right lower facial weakness. Coordination was unremarkable. Patient admits to not being compliant with taking Plavix, as well as blood pressure medication.  LSN: 1:30 PM on 12/28/2012 tPA Given: No: Beyond time under for treatment consideration MRankin: 1  Past Medical History  Diagnosis Date  . ED (erectile dysfunction)   . Depression   . Hyperlipidemia   . History of cerebrovascular accident   . Diabetes mellitus, type II     Family History  Problem Relation Age of Onset  . Heart disease Mother   . Other      no family history of colon cancer     Medications:  Prior to Admission:  Norvasc 10 mg per day Plavix 75 mg per day Zofran 4 mg every 8 hours when necessary Viagra 100 mg per day as needed  Physical Examination: Blood pressure 159/91, pulse 68, temperature 98.5 F (36.9 C), temperature source Oral, resp. rate 18, SpO2 98.00%.  Neurologic Examination: Mental Status: Alert, oriented to his correct age but disoriented to correct month.  Speech fluent without evidence of aphasia. Able to follow commands without difficulty. Cranial Nerves: II-Visual fields were normal. III/IV/VI-Pupils were equal and reacted. Extraocular movements were full and conjugate.    V/VII-equivocal right lower  facial weakness. VIII-normal. X-normal speech and symmetrical palatal movement. Motor: 5/5 bilaterally with normal tone and bulk Sensory: Normal throughout. Deep Tendon Reflexes: 1+ and symmetric in upper extremities and trace to 1+ in lower extremities.. Plantars: Mute bilaterally Cerebellar: Normal finger-to-nose and heel-to-shin testing testing. Carotid auscultation: Normal  Ct Head (brain) Wo Contrast  12/29/2012  *RADIOLOGY REPORT*  Clinical Data: Lightheaded and unsteady gait.  CT HEAD WITHOUT CONTRAST  Technique:  Contiguous axial images were obtained from the base of the skull through the vertex without contrast.  Comparison: CT head without contrast 07/29/2011.  Findings: Remote encephalomalacia of the right occipital lobe is again noted.  Multiple remote lacunar infarcts are again noted in the basal ganglia bilaterally.  Least one infarct in the right caudate head is new since the prior exam, but does appear remote. Areas of hypoattenuation in the cerebellum bilaterally are also new.  These are age indeterminate and may represent acute infarcts.  No hemorrhage or mass lesion is evident.  The ventricles are of normal size.  No significant extra-axial fluid collection is present.  The paranasal sinuses and mastoid air cells are clear.  The osseous skull is intact.  IMPRESSION:  1.  Age indeterminate to infarcts of the cerebellum bilaterally may be acute. 2.  The stable encephalomalacia of the right occipital lobe. 3.  A similar appearance of atrophy and multiple basil ganglia lacunar infarcts.  At least one infarct of the right caudate head is new since the prior study, but does not appear acute.   Original Report Authenticated By: Marin Roberts, M.D.     Assessment: 67 y.o. male  presenting with possible acute cerebellar infarction as etiology for nausea and vomiting as well as ataxia.  Stroke Risk Factors - hyperlipidemia and hypertension  Plan: 1. HgbA1c, fasting lipid panel 2.  MRI, MRA  of the brain without contrast 3. PT consult, OT consult 4. Echocardiogram 5. Carotid dopplers 6. Prophylactic therapy-Antiplatelet med: Plavix  7. Risk factor modification 8. Telemetry monitoring   C.R. Roseanne Reno, MD [facility] [phone]  12/29/2012, 5:32 PM

## 2012-12-29 NOTE — ED Notes (Signed)
Pt came from MD office.  Pt started vomiting yesterday but prior too became lightheaded.  Pt gait is off and since last night.  No extremity deficits.  Pt seen here to rule out stroke.

## 2012-12-29 NOTE — Progress Notes (Signed)
Subjective:    Patient ID: Sean Myers, male    DOB: 1946/06/12, 67 y.o.   MRN: [id number]  HPI  67 year old African American male with history of hypertension, prior CVA, ongoing tobacco use and mild dementia complains of acute nausea and vomiting that started yesterday. Patient denies any unusual food intake. He woke up late in the morning and had a cup of coffee. Patient reported to his wife that he felt lightheaded. She then noticed him stumble and walk with a wide-based gait. "He looked like he was drunk."  Soon thereafter he started vomiting brown thin liquid (4 times).  He denies any coffee-ground emesis. He denies any diarrhea.  Wife also notes his speech is thick and slurred.   Review of Systems Negative for abdominal pain, urinated normally this morning    Past Medical History  Diagnosis Date  . ED (erectile dysfunction)   . Depression   . Hyperlipidemia   . History of cerebrovascular accident   . Diabetes mellitus, type II     History   Social History  . Marital Status: Married    Spouse Name: N/A    Number of Children: N/A  . Years of Education: N/A   Occupational History  . Not on file.   Social History Main Topics  . Smoking status: Current Every Day Smoker -- 1.00 packs/day  . Smokeless tobacco: Not on file  . Alcohol Use: No  . Drug Use: Yes    Special: Marijuana     Comment: marijuana  . Sexually Active: Not on file   Other Topics Concern  . Not on file   Social History Narrative   Retired   Married   Current Smoker    Alcohol use-no         son Lavone Neri living in [place] ([place]) -  engaged to be married    Past Surgical History  Procedure Laterality Date  . Colonoscopy  1990's    [facility]    Family History  Problem Relation Age of Onset  . Heart disease Mother   . Other      no family history of colon cancer    Allergies  Allergen Reactions  . Poliovirus Vaccine Inactivated     Heart races, cold sweats, "almost  killed me".  . Aspirin     Gi upset  . Penicillins Swelling, Palpitations and Rash    Current Outpatient Prescriptions on File Prior to Visit  Medication Sig Dispense Refill  . amLODipine (NORVASC) 10 MG tablet Take 1 tablet (10 mg total) by mouth daily.  90 tablet  1  . clopidogrel (PLAVIX) 75 MG tablet Take 1 tablet (75 mg total) by mouth daily.  90 tablet  1  . glucose blood (ACCU-CHEK AVIVA) test strip Use once a day to check blood sugar       . Lancets (ACCU-CHEK MULTICLIX) lancets Use once a day to check blood sugar       . memantine (NAMENDA) 10 MG tablet Take 1 tablet (10 mg total) by mouth 2 (two) times daily.  60 tablet  5  . pravastatin (PRAVACHOL) 20 MG tablet Take 1 tablet (20 mg total) by mouth daily.  90 tablet  1  . valsartan (DIOVAN) 160 MG tablet Take 1.5 tablets (240 mg total) by mouth daily.  135 tablet  1  . VIAGRA 100 MG tablet take as directed if needed  10 tablet  5   No current facility-administered medications on file prior to  visit.    BP 184/122  Temp(Src) 98 F (36.7 C) (Oral)    Objective:   Physical Exam  Constitutional: He appears well-developed and well-nourished.  HENT:  Head: Normocephalic and atraumatic.  Right Ear: External ear normal.  Left Ear: External ear normal.  Mouth/Throat: Oropharynx is clear and moist.  Thick, slurred speech  Eyes: EOM are normal. Pupils are equal, round, and reactive to light.  Neck: Neck supple.  Cardiovascular: Normal rate, regular rhythm and normal heart sounds.   No murmur heard. Pulmonary/Chest: Effort normal and breath sounds normal. He has no wheezes.  Abdominal: Soft. Bowel sounds are normal. He exhibits no mass. There is no tenderness.  Musculoskeletal: He exhibits no edema.  Lymphadenopathy:    He has no cervical adenopathy.  Neurological: He is alert. He displays normal reflexes. No cranial nerve deficit. He exhibits normal muscle tone. Coordination abnormal.  Wide based ataxic gait  Skin: Skin  is warm and dry.  Psychiatric: He has a normal mood and affect. His behavior is normal.       Assessment & Plan:

## 2012-12-30 ENCOUNTER — Inpatient Hospital Stay (HOSPITAL_COMMUNITY): Payer: Medicare Other

## 2012-12-30 DIAGNOSIS — E785 Hyperlipidemia, unspecified: Secondary | ICD-10-CM

## 2012-12-30 DIAGNOSIS — R112 Nausea with vomiting, unspecified: Secondary | ICD-10-CM

## 2012-12-30 DIAGNOSIS — I10 Essential (primary) hypertension: Secondary | ICD-10-CM

## 2012-12-30 DIAGNOSIS — F17200 Nicotine dependence, unspecified, uncomplicated: Secondary | ICD-10-CM

## 2012-12-30 DIAGNOSIS — R279 Unspecified lack of coordination: Secondary | ICD-10-CM

## 2012-12-30 LAB — GLUCOSE, CAPILLARY
Glucose-Capillary: 94 mg/dL (ref 70–99)
Glucose-Capillary: 106 mg/dL — ABNORMAL HIGH (ref 70–99)
Glucose-Capillary: 135 mg/dL — ABNORMAL HIGH (ref 70–99)

## 2012-12-30 LAB — LIPID PANEL
Cholesterol: 195 mg/dL (ref 0–200)
HDL: 45 mg/dL (ref >39)
Total CHOL/HDL Ratio: 4.3 RATIO
VLDL: 18 mg/dL (ref 0–40)

## 2012-12-30 LAB — HEMOGLOBIN A1C: Mean Plasma Glucose: 117 mg/dL — ABNORMAL HIGH (ref <117)

## 2012-12-30 MED ORDER — SIMVASTATIN 20 MG PO TABS
20.0000 mg | ORAL_TABLET | Freq: Every day | ORAL | Status: DC
  Administered 2012-12-30 – 2012-12-31 (×2): 20 mg via ORAL
  Filled 2012-12-30 (×3): qty 1

## 2012-12-30 MED ORDER — AMLODIPINE BESYLATE 10 MG PO TABS
10.0000 mg | ORAL_TABLET | Freq: Every day | ORAL | Status: DC
  Administered 2012-12-30 – 2012-12-31 (×2): 10 mg via ORAL
  Filled 2012-12-30 (×3): qty 1

## 2012-12-30 MED ORDER — SODIUM CHLORIDE 0.9 % IV SOLN
INTRAVENOUS | Status: DC
  Administered 2013-01-01: 500 mL via INTRAVENOUS

## 2012-12-30 NOTE — Progress Notes (Signed)
Stroke Team Progress Note  HISTORY Sean Myers is an 67 y.o. male the history of hypertension, hyperlipidemia and previous stroke in 2006 presenting with onset of unsteady gait with associated nausea and vomiting starting at about 1:30 yesterday 12/28/2012. He has not noticed focal weakness. Wife indicated he had dysarthria last night, which has resolved. He has had no swallowing difficulty. CT scan of his head showed age indeterminate bilateral cerebellar infarcts, possibly acute, as well as old right occipital infarction and multiple basal ganglia infarctions. NIH stroke score was 1 for facial asymmetry and possibly right lower facial weakness. Coordination was unremarkable. Patient admits to not being compliant with taking Plavix, as well as blood pressure medication. Patient was not a TPA candidate secondary to delay in arrival. He was admitted for further evaluation and treatment.  SUBJECTIVE His family is at the bedside.  Overall he feels his condition is stable.   OBJECTIVE Most recent Vital Signs: Filed Vitals:   12/30/12 0000 12/30/12 0400 12/30/12 0600 12/30/12 0810  BP: 149/77 149/82 141/80 141/87  Pulse: 55  62 64  Temp:   97.6 F (36.4 C) 97.6 F (36.4 C)  TempSrc:   Axillary Axillary  Resp:      Height:      Weight:      SpO2:   96% 98%   CBG (last 3)   Recent Labs  12/29/12 1538 12/30/12 0730  GLUCAP 132* 94    IV Fluid Intake:     MEDICATIONS  . amLODipine  10 mg Oral Daily  . clopidogrel  75 mg Oral Daily  . heparin  5,000 Units Subcutaneous Q8H  . nicotine  14 mg Transdermal Daily  . simvastatin  20 mg Oral q1800   PRN:  ondansetron  Diet:  Cardiac thin liquids Activity:   DVT Prophylaxis:  Heparin 5000 units sq tid   CLINICALLY SIGNIFICANT STUDIES Basic Metabolic Panel:  Recent Labs Lab 12/29/12 1505 12/29/12 2118  NA 143  --   K 3.4*  --   CL 99  --   CO2 31  --   GLUCOSE 124*  --   BUN 13  --   CREATININE 0.91 1.03  CALCIUM 10.5  --     Liver Function Tests:  Recent Labs Lab 12/29/12 1505  AST 24  ALT 9  ALKPHOS 93  BILITOT 0.8  PROT 9.3*  ALBUMIN 4.5   CBC:  Recent Labs Lab 12/29/12 1505 12/29/12 2118  WBC 7.5 7.9  NEUTROABS 4.7  --   HGB 19.1* 18.0*  HCT 53.1* 49.2  MCV 78.9 78.5  PLT 214 194   Coagulation:  Recent Labs Lab 12/29/12 1505  LABPROT 13.6  INR 1.05   Cardiac Enzymes:  Recent Labs Lab 12/29/12 1505 12/29/12 1731  TROPONINI 0.86* 0.99*   Urinalysis: No results found for this basename: COLORURINE, APPERANCEUR, LABSPEC, PHURINE, GLUCOSEU, HGBUR, BILIRUBINUR, KETONESUR, PROTEINUR, UROBILINOGEN, NITRITE, LEUKOCYTESUR,  in the last 168 hours Lipid Panel    Component Value Date/Time   CHOL 195 12/30/2012 0430   TRIG 88 12/30/2012 0430   HDL 45 12/30/2012 0430   CHOLHDL 4.3 12/30/2012 0430   VLDL 18 12/30/2012 0430   LDLCALC 132* 12/30/2012 0430   HgbA1C  Lab Results  Component Value Date   HGBA1C 5.7* 12/29/2012    Urine Drug Screen:   No results found for this basename: labopia, cocainscrnur, labbenz, amphetmu, thcu, labbarb    Alcohol Level: No results found for this basename: ETH,  in the  last 168 hours  CT of the brain  12/29/2012   1.  Age indeterminate to infarcts of the cerebellum bilaterally may be acute. 2.  The stable encephalomalacia of the right occipital lobe. 3.  A similar appearance of atrophy and multiple basil ganglia lacunar infarcts.  At least one infarct of the right caudate head is new since the prior study, but does not appear acute.   MRI of the brain  12/30/2012   Acute infarct mid to inferior aspect of the left cerebellum, inferior aspect of the right cerebellum and mid to inferior aspect of the vermis.  Please see above for additional findings.   MRA of the brain  12/30/2012   Intracranial atherosclerotic type changes more notable involving the posterior circulation   2D Echocardiogram    Carotid Doppler    CXR    EKG  sinus bradycardia.   Therapy  Recommendations   Physical Exam   Middle aged african [place] male not in distress.Awake alert. Afebrile. Head is nontraumatic. Neck is supple without bruit. Hearing is normal. Cardiac exam no murmur or gallop. Lungs are clear to auscultation. Distal pulses are well felt Neurological Exam : Neurological Exam ;  Awake  Alert oriented x 3. Normal speech and language.eye movements full without nystagmus.fundi were not visualized. Vision acuity and fields appear normal. Hearing is normal. Palatal movements are normal. Face symmetric. Tongue midline. Normal strength, tone, reflexes and coordination. Normal sensation. Gait deferred. .ASSESSMENT Mr. Sean Myers is a 67 y.o. male presenting with unsteady gait with associated nausea and vomiting 1 week after stopping taking plavix in patient with previous posterior infarcts in 2005. Imaging confirms a bilateral posterior circulation infarcts. Infarcts felt to be embolic secondary to unknown source.  On no antiplatelets prior to admission. Now on clopidogrel 75 mg orally every day for secondary stroke prevention. Patient with resultant ataxia and nausea. Work up underway.  Hyperlipidemia, LDL 132, on no statin PTA, on zocor 20mg  now, goal LDL < 100 Hx stroke 2005, posterior circulation Diabetes, HgbA1c 5.7, goal < 7.0  Hospital day # 1  TREATMENT/PLAN  Continue  clopidogrel 75 mg orally every day for secondary stroke prevention. TEE to look for embolic source. Arranged with [facility] for tomorrow.  If positive for PFO (patent foramen ovale), check bilateral lower extremity venous dopplers to rule out DVT as possible source of stroke. F/u 2D, carotid doppler Therapy evals  Annie Main, MSN, RN, ANVP-BC, ANP-BC, GNP-BC [facility] Pager: [phone] 12/30/2012 11:41 AM  I have personally obtained a history, examined the patient, evaluated imaging results, and formulated the assessment and plan of care. I agree with the  above. Delia Heady, MD

## 2012-12-30 NOTE — Evaluation (Signed)
Physical Therapy Evaluation Patient Details Name: Sean Myers MRN: [id number] DOB: 1945-10-08 Today's Date: 12/30/2012 Time: 4098-1191 PT Time Calculation (min): 38 min  PT Assessment / Plan / Recommendation Clinical Impression  pt admitted with gait instability after have bouts of nausea/vomiting.  MRI showed subacute bil cerebellar infarcts.  Pt can benefit from PT to maximize function.    PT Assessment  Patient needs continued PT services    Follow Up Recommendations  CIR    Does the patient have the potential to tolerate intense rehabilitation      Barriers to Discharge        Equipment Recommendations  Other (comment) (TBD post acute)    Recommendations for Other Services Rehab consult   Frequency Min 4X/week    Precautions / Restrictions Precautions Precautions: Fall   Pertinent Vitals/Pain       Mobility  Bed Mobility Bed Mobility: Supine to Sit;Sitting - Scoot to Edge of Bed;Sit to Supine Supine to Sit: 7: Independent Sitting - Scoot to Edge of Bed: 7: Independent Sit to Supine: 7: Independent Transfers Transfers: Sit to Stand;Stand to Sit Sit to Stand: 4: Min guard;With upper extremity assist;From bed Stand to Sit: 4: Min guard;With upper extremity assist;To bed Details for Transfer Assistance: mildly guarded and unsteady on initial stand with dizziness. Ambulation/Gait Ambulation/Gait Assistance: 4: Min assist Ambulation Distance (Feet): 200 Feet Assistive device: 1 person hand held assist Ambulation/Gait Assistance Details: unsteady throughout with worsening stability with scanning Gait Pattern: Step-through pattern;Scissoring;Narrow base of support Modified Rankin (Stroke Patients Only) Pre-Morbid Rankin Score: Moderately severe disability Modified Rankin: Moderately severe disability    Exercises     PT Diagnosis: Difficulty walking  PT Problem List: Decreased balance;Decreased mobility;Decreased safety awareness PT Treatment Interventions:  Gait training;Functional mobility training;Therapeutic activities;Balance training;Patient/family education;DME instruction   PT Goals Acute Rehab PT Goals PT Goal Formulation: With patient Time For Goal Achievement: 01/13/13 Potential to Achieve Goals: Good Pt will go Sit to Stand: with modified independence PT Goal: Sit to Stand - Progress: Goal set today Pt will Transfer Bed to Chair/Chair to Bed: with modified independence PT Transfer Goal: Bed to Chair/Chair to Bed - Progress: Goal set today Pt will Ambulate: >150 feet;with supervision;with least restrictive assistive device PT Goal: Ambulate - Progress: Goal set today Pt will Go Up / Down Stairs: 3-5 stairs;with supervision;with rail(s) PT Goal: Up/Down Stairs - Progress: Goal set today  Visit Information  Last PT Received On: 12/30/12 Assistance Needed: +1    Subjective Data  Subjective: I don't think my wife realizes how long this has gone on Patient Stated Goal: I'd like to be able to walk better   Prior Functioning  Home Living Lives With: Spouse Available Help at Discharge: Family;Available 24 hours/day Type of Home: House Home Access: Stairs to enter Home Layout: Two level;Laundry or work area in Artist of Steps: flight Alternate Level Stairs-Rails: Right;Left Bathroom Shower/Tub: Network engineer: None Prior Function Level of Independence: Independent Able to Take Stairs?: Yes Driving: Yes Communication Communication: No difficulties    Cognition  Cognition Arousal/Alertness: Awake/alert Behavior During Therapy: WFL for tasks assessed/performed Overall Cognitive Status: Within Functional Limits for tasks assessed    Extremity/Trunk Assessment Right Upper Extremity Assessment RUE ROM/Strength/Tone: Within functional levels Left Upper Extremity Assessment LUE ROM/Strength/Tone: Within functional levels Right Lower Extremity  Assessment RLE ROM/Strength/Tone: Within functional levels Left Lower Extremity Assessment LLE ROM/Strength/Tone: Within functional levels Trunk Assessment Trunk Assessment: Normal  Balance Balance Balance Assessed: Yes Static Sitting Balance Static Sitting - Balance Support: Feet supported;No upper extremity supported Static Sitting - Level of Assistance: 7: Independent Static Standing Balance Static Standing - Balance Support: No upper extremity supported;During functional activity Static Standing - Level of Assistance: 5: Stand by assistance Dynamic Standing Balance Dynamic Standing - Balance Support: No upper extremity supported Dynamic Standing - Level of Assistance: 4: Min assist  End of Session PT - End of Session Activity Tolerance: Patient tolerated treatment well Patient left: in bed;with call bell/phone within reach;with family/visitor present Nurse Communication: Mobility status  GP     Can Lucci, Eliseo Gum 12/30/2012, 6:26 PM 12/30/2012  [place], PT [phone]  (pager)

## 2012-12-30 NOTE — Progress Notes (Signed)
[facility] PROGRESS NOTE  Sean Myers WUJ:[id number] DOB: 02/26/1946 DOA: 12/29/2012 PCP: Thomos Lemons, DO  Assessment/Plan: 1. Acute bilateral posterior circulation infarcts: neurology on board and will follow rec's. -plavix for secondary prevention (issues of compliance with medication prior to admission) -TEE -PT/OT  2. HTN: continue current medication and low sodium diet. Permissive HTN in setting of acute stroke  3. HLD: will change to zocor for better control, LDL 132  4. Tobacco abuse: counseling provided; will continue nicotine patch  5.Nausea and vomiting: most likely associated with dizziness from CVA. No further episodes reported. Will continue PRN antiemetics.  6.elevated troponin: no CP, no SOB, no EKG changes. Will follow 2-D echo.  DVT: heparin   Code Status: full Family Communication: wife at bedside Disposition Plan: to be determine   Consultants:  neurology  Procedures:  See below for x-ray reports  Antibiotics: None  HPI/Subjective: Afebrile, no CP, no SOB. Feeling better  Objective: Filed Vitals:   12/30/12 0600 12/30/12 0810 12/30/12 1928 12/30/12 1958  BP: 141/80 141/87 135/98 149/94  Pulse: 62 64 65 68  Temp: 97.6 F (36.4 C) 97.6 F (36.4 C) 98.1 F (36.7 C) 98.3 F (36.8 C)  TempSrc: Axillary Axillary Oral Oral  Resp:    18  Height:      Weight:      SpO2: 96% 98% 98% 98%   No intake or output data in the 24 hours ending 12/30/12 2256 Filed Weights   12/29/12 1949  Weight: 75.3 kg (166 lb 0.1 oz)    Exam:   General:  NAD, afebrile  Cardiovascular: S1 and S2, no rubs or gallops  Respiratory: CTA bilaterally  Abdomen: soft, NT, ND, positive BS  Musculoskeletal: no joint swelling or erythema  Data Reviewed: Basic Metabolic Panel:  Recent Labs Lab 12/29/12 1505 12/29/12 2118  NA 143  --   K 3.4*  --   CL 99  --   CO2 31  --   GLUCOSE 124*  --   BUN 13  --   CREATININE 0.91 1.03  CALCIUM 10.5  --     Liver Function Tests:  Recent Labs Lab 12/29/12 1505  AST 24  ALT 9  ALKPHOS 93  BILITOT 0.8  PROT 9.3*  ALBUMIN 4.5   CBC:  Recent Labs Lab 12/29/12 1505 12/29/12 2118  WBC 7.5 7.9  NEUTROABS 4.7  --   HGB 19.1* 18.0*  HCT 53.1* 49.2  MCV 78.9 78.5  PLT 214 194   Cardiac Enzymes:  Recent Labs Lab 12/29/12 1505 12/29/12 1731  TROPONINI 0.86* 0.99*   CBG:  Recent Labs Lab 12/29/12 1538 12/30/12 0730 12/30/12 1151 12/30/12 1637 12/30/12 2138  GLUCAP 132* 94 106* 135* 103*    Studies: Ct Head (brain) Wo Contrast  12/29/2012  *RADIOLOGY REPORT*  Clinical Data: Lightheaded and unsteady gait.  CT HEAD WITHOUT CONTRAST  Technique:  Contiguous axial images were obtained from the base of the skull through the vertex without contrast.  Comparison: CT head without contrast 07/29/2011.  Findings: Remote encephalomalacia of the right occipital lobe is again noted.  Multiple remote lacunar infarcts are again noted in the basal ganglia bilaterally.  Least one infarct in the right caudate head is new since the prior exam, but does appear remote. Areas of hypoattenuation in the cerebellum bilaterally are also new.  These are age indeterminate and may represent acute infarcts.  No hemorrhage or mass lesion is evident.  The ventricles are of normal size.  No  significant extra-axial fluid collection is present.  The paranasal sinuses and mastoid air cells are clear.  The osseous skull is intact.  IMPRESSION:  1.  Age indeterminate to infarcts of the cerebellum bilaterally may be acute. 2.  The stable encephalomalacia of the right occipital lobe. 3.  A similar appearance of atrophy and multiple basil ganglia lacunar infarcts.  At least one infarct of the right caudate head is new since the prior study, but does not appear acute.   Original Report Authenticated By: Marin Roberts, M.D.    Mri Brain Without Contrast  12/30/2012  *RADIOLOGY REPORT*  Clinical Data:  Episode of  vomiting and unsteady gait.  MRI BRAIN WITHOUT CONTRAST MRA HEAD WITHOUT CONTRAST  Technique: Multiplanar, multiecho pulse sequences of the brain and surrounding structures were obtained according to standard protocol without intravenous contrast.  Angiographic images of the head were obtained using MRA technique without contrast.  Comparison: 12/29/2012 CT.  MRI HEAD  Findings:  Acute infarct mid to inferior aspect of the left cerebellum, inferior aspect of the right cerebellum and mid to inferior aspect of the vermis.  There may be tiny blood breakdown products along the peripheral aspect of the left cerebellar hemorrhage. Local mass effect.  Presently no significant compression of the fourth ventricle.  This will need to be monitored closely.  Remote infarcts involving the basal ganglia, thalamus and corona radiata bilaterally greater on the left.  Remote right occipital lobe infarct.  Small vessel disease type changes.  Global atrophy without hydrocephalus.  No intracranial hemorrhage separate from residual blood breakdown products associated with left caudate/lenticular nucleus remote infarct and questionable tiny hemorrhage along the peripheral aspect of the acute left cerebellar infarct.  No intracranial mass lesion detected on this unenhanced exam.  Paranasal sinus opacification.  Mild exophthalmos.  IMPRESSION: Acute infarct mid to inferior aspect of the left cerebellum, inferior aspect of the right cerebellum and mid to inferior aspect of the vermis.  Please see above for additional findings.  MRA HEAD  Findings: Nonvisualization PICA and AICA bilaterally.  No significant stenosis distal vertebral arteries.  Mild to moderate narrowing proximal to mid basilar artery. Moderate to marked narrowing distal basilar artery.  Prominent narrowing irregularity superior cerebral arteries greater left.  Moderate to marked tandem stenoses right posterior cerebral artery with poor delineation distal branches consistent  with the patient's remote infarct.  Mild to moderate narrowing left posterior cerebral artery.  Anterior circulation without medium or large size vessel significant stenosis or occlusion.  Middle cerebral artery moderate to marked branch vessel irregularity with decreased number of visualized branch vessels consistent with remote infarcts.  Mild irregularity A1 segment right anterior cerebral artery and supraclinoid aspect of the internal carotid artery.  No aneurysm noted.  IMPRESSION: Intracranial atherosclerotic type changes more notable involving the posterior circulation as detailed above.  This has been made a PRA call report utilizing dashboard call feature.   Original Report Authenticated By: Lacy Duverney, M.D.    Mr Mra Head/brain Wo Cm  12/30/2012  *RADIOLOGY REPORT*  Clinical Data:  Episode of vomiting and unsteady gait.  MRI BRAIN WITHOUT CONTRAST MRA HEAD WITHOUT CONTRAST  Technique: Multiplanar, multiecho pulse sequences of the brain and surrounding structures were obtained according to standard protocol without intravenous contrast.  Angiographic images of the head were obtained using MRA technique without contrast.  Comparison: 12/29/2012 CT.  MRI HEAD  Findings:  Acute infarct mid to inferior aspect of the left cerebellum, inferior aspect of the right cerebellum  and mid to inferior aspect of the vermis.  There may be tiny blood breakdown products along the peripheral aspect of the left cerebellar hemorrhage. Local mass effect.  Presently no significant compression of the fourth ventricle.  This will need to be monitored closely.  Remote infarcts involving the basal ganglia, thalamus and corona radiata bilaterally greater on the left.  Remote right occipital lobe infarct.  Small vessel disease type changes.  Global atrophy without hydrocephalus.  No intracranial hemorrhage separate from residual blood breakdown products associated with left caudate/lenticular nucleus remote infarct and questionable  tiny hemorrhage along the peripheral aspect of the acute left cerebellar infarct.  No intracranial mass lesion detected on this unenhanced exam.  Paranasal sinus opacification.  Mild exophthalmos.  IMPRESSION: Acute infarct mid to inferior aspect of the left cerebellum, inferior aspect of the right cerebellum and mid to inferior aspect of the vermis.  Please see above for additional findings.  MRA HEAD  Findings: Nonvisualization PICA and AICA bilaterally.  No significant stenosis distal vertebral arteries.  Mild to moderate narrowing proximal to mid basilar artery. Moderate to marked narrowing distal basilar artery.  Prominent narrowing irregularity superior cerebral arteries greater left.  Moderate to marked tandem stenoses right posterior cerebral artery with poor delineation distal branches consistent with the patient's remote infarct.  Mild to moderate narrowing left posterior cerebral artery.  Anterior circulation without medium or large size vessel significant stenosis or occlusion.  Middle cerebral artery moderate to marked branch vessel irregularity with decreased number of visualized branch vessels consistent with remote infarcts.  Mild irregularity A1 segment right anterior cerebral artery and supraclinoid aspect of the internal carotid artery.  No aneurysm noted.  IMPRESSION: Intracranial atherosclerotic type changes more notable involving the posterior circulation as detailed above.  This has been made a PRA call report utilizing dashboard call feature.   Original Report Authenticated By: Lacy Duverney, M.D.     Scheduled Meds: . amLODipine  10 mg Oral Daily  . clopidogrel  75 mg Oral Daily  . heparin  5,000 Units Subcutaneous Q8H  . nicotine  14 mg Transdermal Daily  . simvastatin  20 mg Oral q1800   Continuous Infusions: . sodium chloride      Active Problems:   TOBACCO ABUSE   HYPERTENSION   DUODENAL ULCER, HX OF   Ataxia   Cerebellar stroke, acute    Time spent:> 30  minutes    Thailyn Khalid  [facility] Pager [phone]. If 7PM-7AM, please contact night-coverage at [email or web address], password [email or web address] 12/30/2012, 10:56 PM  LOS: 1 day

## 2012-12-31 ENCOUNTER — Encounter (HOSPITAL_COMMUNITY)
Admission: EM | Disposition: A | Discharge status: Home / Self Care | Payer: Self-pay | Source: Home / Self Care | Attending: Internal Medicine

## 2012-12-31 ENCOUNTER — Encounter (HOSPITAL_COMMUNITY): Payer: Self-pay | Admitting: Gastroenterology

## 2012-12-31 DIAGNOSIS — I6330 Cerebral infarction due to thrombosis of unspecified cerebral artery: Secondary | ICD-10-CM

## 2012-12-31 DIAGNOSIS — R404 Transient alteration of awareness: Secondary | ICD-10-CM

## 2012-12-31 LAB — GLUCOSE, CAPILLARY
Glucose-Capillary: 110 mg/dL — ABNORMAL HIGH (ref 70–99)
Glucose-Capillary: 176 mg/dL — ABNORMAL HIGH (ref 70–99)

## 2012-12-31 SURGERY — CANCELLED PROCEDURE

## 2012-12-31 MED ORDER — MIDAZOLAM HCL 5 MG/ML IJ SOLN
INTRAMUSCULAR | Status: AC
  Filled 2012-12-31: qty 2

## 2012-12-31 MED ORDER — HALOPERIDOL LACTATE 5 MG/ML IJ SOLN
1.0000 mg | Freq: Once | INTRAMUSCULAR | Status: AC
  Administered 2013-01-01: 1 mg via INTRAVENOUS
  Filled 2012-12-31: qty 0.2

## 2012-12-31 MED ORDER — QUETIAPINE FUMARATE 25 MG PO TABS
75.0000 mg | ORAL_TABLET | Freq: Once | ORAL | Status: DC
  Filled 2012-12-31: qty 1

## 2012-12-31 MED ORDER — FENTANYL CITRATE 0.05 MG/ML IJ SOLN
INTRAMUSCULAR | Status: AC
  Filled 2012-12-31: qty 2

## 2012-12-31 MED ORDER — HALOPERIDOL LACTATE 5 MG/ML IJ SOLN
1.0000 mg | Freq: Two times a day (BID) | INTRAMUSCULAR | Status: DC | PRN
  Administered 2012-12-31: 1 mg via INTRAVENOUS
  Filled 2012-12-31: qty 0.2

## 2012-12-31 NOTE — Progress Notes (Signed)
[facility] PROGRESS NOTE  Sean Myers MRN:[id number] DOB: 07/09/1946 DOA: 12/29/2012 PCP: Robert Yoo, DO  Assessment/Plan: 1. Acute bilateral posterior circulation infarcts: neurology on board and will follow rec's. -plavix for secondary prevention (issues of compliance with medication prior to admission) -TEE plan for tomorrow. -PT/OT recommending CIR -2-D echo w/o source of emboli  2. HTN: continue current medication and low sodium diet. Permissive HTN in setting of acute stroke  3. HLD: will change to zocor for better control, LDL 132  4. Tobacco abuse: counseling provided; will continue nicotine patch  5. Nausea and vomiting: most likely associated with dizziness from CVA. No further episodes reported. Will continue PRN antiemetics.  6. elevated troponin: no CP, no SOB, no EKG changes. 2-D echo w/o wall motion abnormalities and preserved EF.  7. delirium: will order seroquel; PRN haldol and bedside sitter. Constant reorientation.  8. Grade 1 diastolic dysfunction: due to uncontrolled HTN. Will control BP; continue low sodium diet. No symptoms of acute CHF.  DVT: heparin   Code Status: full Family Communication: wife at bedside Disposition Plan: to be determine; CIR consulted; if patient do not qualify for admission to inpatient rehab will d/c home with HH services.   Consultants:  neurology  Procedures:  See below for x-ray reports  Antibiotics: None  HPI/Subjective: Afebrile, no CP, no SOB. Feeling better overall. Patient is mild agitated and with acute delirium (per wife he has experienced same behavior on previous hospitalization)  Objective: Filed Vitals:   12/31/12 1317 12/31/12 1427 12/31/12 1635 12/31/12 2000  BP: 142/84 139/90 151/92 115/82  Pulse: 80 74 81 87  Temp: 98.6 F (37 C) 98.2 F (36.8 C) 98.7 F (37.1 C) 97.8 F (36.6 C)  TempSrc: Oral Oral Oral Oral  Resp: 18 19 18   Height:  5' 11" (1.803 m)    Weight:  75.297 kg (166 lb)     SpO2: 99%  97% 99%    Intake/Output Summary (Last 24 hours) at 12/31/12 2141 Last data filed at 12/31/12 1310  Gross per 24 hour  Intake    480 ml  Output      0 ml  Net    480 ml   Filed Weights   12/29/12 1949 12/31/12 1427  Weight: 75.3 kg (166 lb 0.1 oz) 75.297 kg (166 lb)    Exam:   General: mild agitation and confusion (delirium); afebrile  Cardiovascular: S1 and S2, no rubs or gallops; neg murmurs  Respiratory: CTA bilaterally  Abdomen: soft, NT, ND, positive BS  Musculoskeletal: no joint swelling or erythema  Data Reviewed: Basic Metabolic Panel:  Recent Labs Lab 12/29/12 1505 12/29/12 2118  NA 143  --   K 3.4*  --   CL 99  --   CO2 31  --   GLUCOSE 124*  --   BUN 13  --   CREATININE 0.91 1.03  CALCIUM 10.5  --    Liver Function Tests:  Recent Labs Lab 12/29/12 1505  AST 24  ALT 9  ALKPHOS 93  BILITOT 0.8  PROT 9.3*  ALBUMIN 4.5   CBC:  Recent Labs Lab 12/29/12 1505 12/29/12 2118  WBC 7.5 7.9  NEUTROABS 4.7  --   HGB 19.1* 18.0*  HCT 53.1* 49.2  MCV 78.9 78.5  PLT 214 194   Cardiac Enzymes:  Recent Labs Lab 12/29/12 1505 12/29/12 1731  TROPONINI 0.86* 0.99*   CBG:  Recent Labs Lab 12/30/12 1637 12/30/12 2138 12/31/12 0719 12/31/12 1209 12/31/12 1703    GLUCAP 135* 103* 110* 174* 151*    Studies: Mri Brain Without Contrast  12/30/2012  *RADIOLOGY REPORT*  Clinical Data:  Episode of vomiting and unsteady gait.  MRI BRAIN WITHOUT CONTRAST MRA HEAD WITHOUT CONTRAST  Technique: Multiplanar, multiecho pulse sequences of the brain and surrounding structures were obtained according to standard protocol without intravenous contrast.  Angiographic images of the head were obtained using MRA technique without contrast.  Comparison: 12/29/2012 CT.  MRI HEAD  Findings:  Acute infarct mid to inferior aspect of the left cerebellum, inferior aspect of the right cerebellum and mid to inferior aspect of the vermis.  There may be tiny  blood breakdown products along the peripheral aspect of the left cerebellar hemorrhage. Local mass effect.  Presently no significant compression of the fourth ventricle.  This will need to be monitored closely.  Remote infarcts involving the basal ganglia, thalamus and corona radiata bilaterally greater on the left.  Remote right occipital lobe infarct.  Small vessel disease type changes.  Global atrophy without hydrocephalus.  No intracranial hemorrhage separate from residual blood breakdown products associated with left caudate/lenticular nucleus remote infarct and questionable tiny hemorrhage along the peripheral aspect of the acute left cerebellar infarct.  No intracranial mass lesion detected on this unenhanced exam.  Paranasal sinus opacification.  Mild exophthalmos.  IMPRESSION: Acute infarct mid to inferior aspect of the left cerebellum, inferior aspect of the right cerebellum and mid to inferior aspect of the vermis.  Please see above for additional findings.  MRA HEAD  Findings: Nonvisualization PICA and AICA bilaterally.  No significant stenosis distal vertebral arteries.  Mild to moderate narrowing proximal to mid basilar artery. Moderate to marked narrowing distal basilar artery.  Prominent narrowing irregularity superior cerebral arteries greater left.  Moderate to marked tandem stenoses right posterior cerebral artery with poor delineation distal branches consistent with the patient's remote infarct.  Mild to moderate narrowing left posterior cerebral artery.  Anterior circulation without medium or large size vessel significant stenosis or occlusion.  Middle cerebral artery moderate to marked branch vessel irregularity with decreased number of visualized branch vessels consistent with remote infarcts.  Mild irregularity A1 segment right anterior cerebral artery and supraclinoid aspect of the internal carotid artery.  No aneurysm noted.  IMPRESSION: Intracranial atherosclerotic type changes more  notable involving the posterior circulation as detailed above.  This has been made a PRA call report utilizing dashboard call feature.   Original Report Authenticated By: Steven Olson, M.D.    Mr Mra Head/brain Wo Cm  12/30/2012  *RADIOLOGY REPORT*  Clinical Data:  Episode of vomiting and unsteady gait.  MRI BRAIN WITHOUT CONTRAST MRA HEAD WITHOUT CONTRAST  Technique: Multiplanar, multiecho pulse sequences of the brain and surrounding structures were obtained according to standard protocol without intravenous contrast.  Angiographic images of the head were obtained using MRA technique without contrast.  Comparison: 12/29/2012 CT.  MRI HEAD  Findings:  Acute infarct mid to inferior aspect of the left cerebellum, inferior aspect of the right cerebellum and mid to inferior aspect of the vermis.  There may be tiny blood breakdown products along the peripheral aspect of the left cerebellar hemorrhage. Local mass effect.  Presently no significant compression of the fourth ventricle.  This will need to be monitored closely.  Remote infarcts involving the basal ganglia, thalamus and corona radiata bilaterally greater on the left.  Remote right occipital lobe infarct.  Small vessel disease type changes.  Global atrophy without hydrocephalus.  No intracranial hemorrhage separate from   residual blood breakdown products associated with left caudate/lenticular nucleus remote infarct and questionable tiny hemorrhage along the peripheral aspect of the acute left cerebellar infarct.  No intracranial mass lesion detected on this unenhanced exam.  Paranasal sinus opacification.  Mild exophthalmos.  IMPRESSION: Acute infarct mid to inferior aspect of the left cerebellum, inferior aspect of the right cerebellum and mid to inferior aspect of the vermis.  Please see above for additional findings.  MRA HEAD  Findings: Nonvisualization PICA and AICA bilaterally.  No significant stenosis distal vertebral arteries.  Mild to moderate  narrowing proximal to mid basilar artery. Moderate to marked narrowing distal basilar artery.  Prominent narrowing irregularity superior cerebral arteries greater left.  Moderate to marked tandem stenoses right posterior cerebral artery with poor delineation distal branches consistent with the patient's remote infarct.  Mild to moderate narrowing left posterior cerebral artery.  Anterior circulation without medium or large size vessel significant stenosis or occlusion.  Middle cerebral artery moderate to marked branch vessel irregularity with decreased number of visualized branch vessels consistent with remote infarcts.  Mild irregularity A1 segment right anterior cerebral artery and supraclinoid aspect of the internal carotid artery.  No aneurysm noted.  IMPRESSION: Intracranial atherosclerotic type changes more notable involving the posterior circulation as detailed above.  This has been made a PRA call report utilizing dashboard call feature.   Original Report Authenticated By: Steven Olson, M.D.     Scheduled Meds: . amLODipine  10 mg Oral Daily  . clopidogrel  75 mg Oral Daily  . heparin  5,000 Units Subcutaneous Q8H  . nicotine  14 mg Transdermal Daily  . QUEtiapine  75 mg Oral Once  . simvastatin  20 mg Oral q1800   Continuous Infusions: . sodium chloride      Active Problems:   TOBACCO ABUSE   HYPERTENSION   DUODENAL ULCER, HX OF   Ataxia   Cerebellar stroke, acute    Time spent:> 30 minutes    Uilani Sanville  [facility] Pager [phone]. If 7PM-7AM, please contact night-coverage at [email or web address], password [email or web address] 12/31/2012, 9:41 PM  LOS: 2 days              

## 2012-12-31 NOTE — ED Provider Notes (Signed)
I have personally seen and examined the patient.  I have discussed the plan of care with the resident.  I have reviewed the documentation on PMH/FH/Soc. History.  I have reviewed the documentation of the resident and agree.  I have reviewed and agree with the ECG interpretation(s) documented by the resident.  Pt stable in the ED.  Stroke symptoms for over 24 hours.  Admitted to medicine   Joya Gaskins, MD 12/31/12 [phone]

## 2012-12-31 NOTE — Progress Notes (Signed)
VASCULAR LAB PRELIMINARY  PRELIMINARY  PRELIMINARY  PRELIMINARY  Carotid duplex  completed.    Preliminary report:  Bilateral:  No evidence of hemodynamically significant internal carotid artery stenosis.   Vertebral artery flow is antegrade.      Sahid Borba, RVT 12/31/2012, 8:40 AM

## 2012-12-31 NOTE — Consult Note (Signed)
Physical Medicine and Rehabilitation Consult Reason for Consult:Embolic CVA Referring Physician: [facility]   HPI: Sean Myers is a 67 y.o. right-handed male with history of diabetes mellitus with peripheral neuropathy, CVA 2006 with little residual weakness. Presented 12/29/2012 with nausea ,vomiting, altered mental status and poor balance as well as slurred speech. MRI and imaging revealed bilateral posterior circulation infarcts as well as remote infarcts involving the basal ganglia, thalamus and corona radiata bilaterally greater on the left and remote right occipital lobe infarct. Patient did not receive TPA. MRA of the brain with atherosclerotic type changes. Echocardiogram/TEE and carotid Dopplers pending. Neurology services consulted maintained on Plavix as prior to hospital admission as well the addition of subcutaneous heparin for DVT prophylaxis. A NicoDerm patch was added for history of tobacco abuse. Physical therapy evaluation completed 12/30/2012 with recommendations of physical medicine rehabilitation consult to consider inpatient rehabilitation services.   Review of Systems  Gastrointestinal: Positive for nausea and vomiting.  Musculoskeletal: Positive for myalgias.  Psychiatric/Behavioral: Positive for depression.  All other systems reviewed and are negative.   Past Medical History  Diagnosis Date  . ED (erectile dysfunction)   . Depression   . Hyperlipidemia   . History of cerebrovascular accident   . Diabetes mellitus, type II    Past Surgical History  Procedure Laterality Date  . Colonoscopy  1990's    [facility]   Family History  Problem Relation Age of Onset  . Heart disease Mother   . Other      no family history of colon cancer   Social History:  reports that he has been smoking.  He does not have any smokeless tobacco history on file. He reports that he uses illicit drugs (Marijuana). He reports that he does not drink alcohol. Allergies:  Allergies   Allergen Reactions  . Poliovirus Vaccine Inactivated     Heart races, cold sweats, "almost killed me".  . Aspirin     Gi upset  . Penicillins Swelling, Palpitations and Rash   Medications Prior to Admission  Medication Sig Dispense Refill  . amLODipine (NORVASC) 10 MG tablet Take 1 tablet (10 mg total) by mouth daily.  90 tablet  1  . clopidogrel (PLAVIX) 75 MG tablet Take 1 tablet (75 mg total) by mouth daily.  90 tablet  1  . ondansetron (ZOFRAN) 4 MG tablet Take 1 tablet (4 mg total) by mouth every 8 (eight) hours as needed for nausea.  30 tablet  0  . sildenafil (VIAGRA) 100 MG tablet Take 100 mg by mouth daily as needed for erectile dysfunction.      Marland Kitchen glucose blood (ACCU-CHEK AVIVA) test strip Use once a day to check blood sugar       . Lancets (ACCU-CHEK MULTICLIX) lancets Use once a day to check blood sugar         Home: Home Living Lives With: Spouse Available Help at Discharge: Family;Available 24 hours/day Type of Home: House Home Access: Stairs to enter Home Layout: Two level;Laundry or work area in basement Alternate Teacher, music of Steps: flight Alternate Level Stairs-Rails: Right;Left Bathroom Shower/Tub: Network engineer: None  Functional History: Prior Function Able to Take Stairs?: Yes Driving: Yes Functional Status:  Mobility: Bed Mobility Bed Mobility: Supine to Sit;Sitting - Scoot to Edge of Bed;Sit to Supine Supine to Sit: 7: Independent Sitting - Scoot to Edge of Bed: 7: Independent Sit to Supine: 7: Independent Transfers Transfers: Sit to Stand;Stand to [employer]  to Stand: 4: Min guard;With upper extremity assist;From bed Stand to Sit: 4: Min guard;With upper extremity assist;To bed Ambulation/Gait Ambulation/Gait Assistance: 4: Min assist Ambulation Distance (Feet): 200 Feet Assistive device: 1 person hand held assist Ambulation/Gait Assistance Details: unsteady throughout with worsening  stability with scanning Gait Pattern: Step-through pattern;Scissoring;Narrow base of support    ADL:    Cognition: Cognition Overall Cognitive Status: Within Functional Limits for tasks assessed Arousal/Alertness: Awake/alert Orientation Level: Oriented X4 Cognition Arousal/Alertness: Awake/alert Behavior During Therapy: WFL for tasks assessed/performed Overall Cognitive Status: Within Functional Limits for tasks assessed  Blood pressure 138/95, pulse 81, temperature 97.9 F (36.6 C), temperature source Oral, resp. rate 18, height 5\' 11"  (1.803 m), weight 75.3 kg (166 lb 0.1 oz), SpO2 100.00%. Physical Exam  Vitals reviewed. Constitutional: He appears well-developed and well-nourished.  HENT:  Head: Normocephalic and atraumatic.  Right Ear: External ear normal.  Left Ear: External ear normal.  Eyes: Pupils are equal, round, and reactive to light.  Pupils round and reactive to light  Neck: Neck supple. No thyromegaly present.  Cardiovascular: Normal rate and regular rhythm.   Pulmonary/Chest: Effort normal and breath sounds normal. No respiratory distress. He has no wheezes.  Abdominal: Bowel sounds are normal. He exhibits no distension. There is no tenderness.  Musculoskeletal: He exhibits no edema.  Neurological:  Patient was a bit lethargic but arousable. He was able to state his name, age and date of birth. He followed simple commands.no gross limb ataxia. No nystagmus. Motor symmetrical  Skin: Skin is warm and dry.    Results for orders placed during the hospital encounter of 12/29/12 (from the past 24 hour(s))  GLUCOSE, CAPILLARY     Status: None   Collection Time    12/30/12  7:30 AM      Result Value Range   Glucose-Capillary 94  70 - 99 mg/dL  GLUCOSE, CAPILLARY     Status: Abnormal   Collection Time    12/30/12 11:51 AM      Result Value Range   Glucose-Capillary 106 (*) 70 - 99 mg/dL  GLUCOSE, CAPILLARY     Status: Abnormal   Collection Time    12/30/12   4:37 PM      Result Value Range   Glucose-Capillary 135 (*) 70 - 99 mg/dL  GLUCOSE, CAPILLARY     Status: Abnormal   Collection Time    12/30/12  9:38 PM      Result Value Range   Glucose-Capillary 103 (*) 70 - 99 mg/dL   Comment 1 Notify RN     Ct Head (brain) Wo Contrast  12/29/2012  *RADIOLOGY REPORT*  Clinical Data: Lightheaded and unsteady gait.  CT HEAD WITHOUT CONTRAST  Technique:  Contiguous axial images were obtained from the base of the skull through the vertex without contrast.  Comparison: CT head without contrast 07/29/2011.  Findings: Remote encephalomalacia of the right occipital lobe is again noted.  Multiple remote lacunar infarcts are again noted in the basal ganglia bilaterally.  Least one infarct in the right caudate head is new since the prior exam, but does appear remote. Areas of hypoattenuation in the cerebellum bilaterally are also new.  These are age indeterminate and may represent acute infarcts.  No hemorrhage or mass lesion is evident.  The ventricles are of normal size.  No significant extra-axial fluid collection is present.  The paranasal sinuses and mastoid air cells are clear.  The osseous skull is intact.  IMPRESSION:  1.  Age indeterminate  to infarcts of the cerebellum bilaterally may be acute. 2.  The stable encephalomalacia of the right occipital lobe. 3.  A similar appearance of atrophy and multiple basil ganglia lacunar infarcts.  At least one infarct of the right caudate head is new since the prior study, but does not appear acute.   Original Report Authenticated By: Marin Roberts, M.D.    Mri Brain Without Contrast  12/30/2012  *RADIOLOGY REPORT*  Clinical Data:  Episode of vomiting and unsteady gait.  MRI BRAIN WITHOUT CONTRAST MRA HEAD WITHOUT CONTRAST  Technique: Multiplanar, multiecho pulse sequences of the brain and surrounding structures were obtained according to standard protocol without intravenous contrast.  Angiographic images of the head were  obtained using MRA technique without contrast.  Comparison: 12/29/2012 CT.  MRI HEAD  Findings:  Acute infarct mid to inferior aspect of the left cerebellum, inferior aspect of the right cerebellum and mid to inferior aspect of the vermis.  There may be tiny blood breakdown products along the peripheral aspect of the left cerebellar hemorrhage. Local mass effect.  Presently no significant compression of the fourth ventricle.  This will need to be monitored closely.  Remote infarcts involving the basal ganglia, thalamus and corona radiata bilaterally greater on the left.  Remote right occipital lobe infarct.  Small vessel disease type changes.  Global atrophy without hydrocephalus.  No intracranial hemorrhage separate from residual blood breakdown products associated with left caudate/lenticular nucleus remote infarct and questionable tiny hemorrhage along the peripheral aspect of the acute left cerebellar infarct.  No intracranial mass lesion detected on this unenhanced exam.  Paranasal sinus opacification.  Mild exophthalmos.  IMPRESSION: Acute infarct mid to inferior aspect of the left cerebellum, inferior aspect of the right cerebellum and mid to inferior aspect of the vermis.  Please see above for additional findings.  MRA HEAD  Findings: Nonvisualization PICA and AICA bilaterally.  No significant stenosis distal vertebral arteries.  Mild to moderate narrowing proximal to mid basilar artery. Moderate to marked narrowing distal basilar artery.  Prominent narrowing irregularity superior cerebral arteries greater left.  Moderate to marked tandem stenoses right posterior cerebral artery with poor delineation distal branches consistent with the patient's remote infarct.  Mild to moderate narrowing left posterior cerebral artery.  Anterior circulation without medium or large size vessel significant stenosis or occlusion.  Middle cerebral artery moderate to marked branch vessel irregularity with decreased number of  visualized branch vessels consistent with remote infarcts.  Mild irregularity A1 segment right anterior cerebral artery and supraclinoid aspect of the internal carotid artery.  No aneurysm noted.  IMPRESSION: Intracranial atherosclerotic type changes more notable involving the posterior circulation as detailed above.  This has been made a PRA call report utilizing dashboard call feature.   Original Report Authenticated By: Lacy Duverney, M.D.    Mr Mra Head/brain Wo Cm  12/30/2012  *RADIOLOGY REPORT*  Clinical Data:  Episode of vomiting and unsteady gait.  MRI BRAIN WITHOUT CONTRAST MRA HEAD WITHOUT CONTRAST  Technique: Multiplanar, multiecho pulse sequences of the brain and surrounding structures were obtained according to standard protocol without intravenous contrast.  Angiographic images of the head were obtained using MRA technique without contrast.  Comparison: 12/29/2012 CT.  MRI HEAD  Findings:  Acute infarct mid to inferior aspect of the left cerebellum, inferior aspect of the right cerebellum and mid to inferior aspect of the vermis.  There may be tiny blood breakdown products along the peripheral aspect of the left cerebellar hemorrhage. Local mass effect.  Presently no significant compression of the fourth ventricle.  This will need to be monitored closely.  Remote infarcts involving the basal ganglia, thalamus and corona radiata bilaterally greater on the left.  Remote right occipital lobe infarct.  Small vessel disease type changes.  Global atrophy without hydrocephalus.  No intracranial hemorrhage separate from residual blood breakdown products associated with left caudate/lenticular nucleus remote infarct and questionable tiny hemorrhage along the peripheral aspect of the acute left cerebellar infarct.  No intracranial mass lesion detected on this unenhanced exam.  Paranasal sinus opacification.  Mild exophthalmos.  IMPRESSION: Acute infarct mid to inferior aspect of the left cerebellum, inferior  aspect of the right cerebellum and mid to inferior aspect of the vermis.  Please see above for additional findings.  MRA HEAD  Findings: Nonvisualization PICA and AICA bilaterally.  No significant stenosis distal vertebral arteries.  Mild to moderate narrowing proximal to mid basilar artery. Moderate to marked narrowing distal basilar artery.  Prominent narrowing irregularity superior cerebral arteries greater left.  Moderate to marked tandem stenoses right posterior cerebral artery with poor delineation distal branches consistent with the patient's remote infarct.  Mild to moderate narrowing left posterior cerebral artery.  Anterior circulation without medium or large size vessel significant stenosis or occlusion.  Middle cerebral artery moderate to marked branch vessel irregularity with decreased number of visualized branch vessels consistent with remote infarcts.  Mild irregularity A1 segment right anterior cerebral artery and supraclinoid aspect of the internal carotid artery.  No aneurysm noted.  IMPRESSION: Intracranial atherosclerotic type changes more notable involving the posterior circulation as detailed above.  This has been made a PRA call report utilizing dashboard call feature.   Original Report Authenticated By: Lacy Duverney, M.D.     Assessment/Plan: Diagnosis: bicerebellar infarcts with truncal ataxia and gait deficits 1. Does the need for close, 24 hr/day medical supervision in concert with the patient's rehab needs make it unreasonable for this patient to be served in a less intensive setting? Yes 2. Co-Morbidities requiring supervision/potential complications: htn, depression 3. Due to bladder management, bowel management, safety, skin/wound care, disease management, medication administration, pain management and patient education, does the patient require 24 hr/day rehab nursing? Yes and Potentially 4. Does the patient require coordinated care of a physician, rehab nurse, PT (1-2  hrs/day, 5 days/week) and OT (1-2 hrs/day, 5 days/week) to address physical and functional deficits in the context of the above medical diagnosis(es)? Yes and Potentially Addressing deficits in the following areas: balance, endurance, locomotion, strength, transferring, bowel/bladder control, bathing, dressing, feeding, grooming, toileting and psychosocial support 5. Can the patient actively participate in an intensive therapy program of at least 3 hrs of therapy per day at least 5 days per week? Yes 6. The potential for patient to make measurable gains while on inpatient rehab is good 7. Anticipated functional outcomes upon discharge from inpatient rehab are mod I with PT, mod I with OT, n/a with SLP. 8. Estimated rehab length of stay to reach the above functional goals is: 7-10 days 9. Does the patient have adequate social supports to accommodate these discharge functional goals? Yes 10. Anticipated D/C setting: Home 11. Anticipated post D/C treatments: HH therapy 12. Overall Rehab/Functional Prognosis: excellent  RECOMMENDATIONS: This patient's condition is appropriate for continued rehabilitative care in the following setting: CIR Patient has agreed to participate in recommended program. Yes and Potentially Note that insurance prior authorization may be required for reimbursement for recommended care.  Comment: Needs OT eval. Think he could reach  mod I goals. Limited by vestibular sx, balance at present. Wife is available apparently at home if needed. Rehab RN to follow up.   Ranelle Oyster, MD, [place] [facility]     12/31/2012

## 2012-12-31 NOTE — Progress Notes (Signed)
  Echocardiogram 2D Echocardiogram has been performed.  Sean Myers 12/31/2012, 8:57 AM

## 2012-12-31 NOTE — Progress Notes (Signed)
Rehab Admissions Coordinator Note:  Patient was screened by Trish Mage for appropriateness for an Inpatient Acute Rehab Consult.  An inpatient rehab consult was completed by Dr. Riley Kill this am.  Lutricia Horsfall will follow this patient and can be reached at [phone].   Trish Mage 12/31/2012, 9:38 AM  I can be reached at [phone].

## 2012-12-31 NOTE — Progress Notes (Signed)
Pt is restless and wants to leave and go home. He pulled is telemetry leads off, according to his wife, he had the same type of behavior last hospital stay. Called attending physician and received order for Seroquel Po ,Haldol IV PRN and safety sitter as needed . Pt refused to take Seroquel PO. Will continue to monitor pt.

## 2012-12-31 NOTE — Progress Notes (Signed)
Physical Therapy Treatment Patient Details Name: Sean Myers MRN: [id number] DOB: 19-Jan-1946 Today's Date: 12/31/2012 Time: 0115-0150 PT Time Calculation (min): 35 min  PT Assessment / Plan / Recommendation Comments on Treatment Session  Pt agreeable to participate in therapy this afternoon.  initiated ambulation with HHA but pt unsteady, requiring mod (A), therefore transitioned to RW with pt demonstrating increased stability however still needing min (A) for balance.  Unsure if pt completely aware of need for (A); pt's wife reports pt has been ambulating to<>from bathroom by himself.  Cont to strongly recommend CIR at d/c to maximize independence with functional mobility & safety.      Follow Up Recommendations  CIR     Does the patient have the potential to tolerate intense rehabilitation     Barriers to Discharge        Equipment Recommendations       Recommendations for Other Services    Frequency Min 4X/week   Plan Discharge plan remains appropriate    Precautions / Restrictions Precautions Precautions: Fall       Mobility  Bed Mobility Bed Mobility: Supine to Sit;Sitting - Scoot to Edge of Bed Supine to Sit: 7: Independent Sitting - Scoot to Edge of Bed: 7: Independent Sit to Supine: 7: Independent Transfers Transfers: Sit to Stand;Stand to Sit Sit to Stand: 4: Min assist;With upper extremity assist;From bed;With armrests;From chair/3-in-1 Stand to Sit: 4: Min guard;With upper extremity assist;With armrests;To bed;To chair/3-in-1 Details for Transfer Assistance: cues for hand placement & technique.  (A) for stability with initial standing.   Ambulation/Gait Ambulation/Gait Assistance: 3: Mod assist;4: Min assist Ambulation Distance (Feet): 100 Feet Assistive device: 1 person hand held assist;Rolling walker Ambulation/Gait Assistance Details: Pt required mod (A) for stability/balance with HHA, pt also reaching out for enviornmental surfaces with unsupported UE  for increased stability therefore transitioned to RW & pt with increased stability but cont'd to require min (A).  Pt with narrow BOS, small step/stride length, & unsteady.   Gait Pattern: Step-through pattern;Decreased stride length;Narrow base of support (decreased floor clearance) General Gait Details: Unable to increase ambulation distance due to pt c/o fatigue & requesting to return to room.   Modified Rankin (Stroke Patients Only) Pre-Morbid Rankin Score: Moderately severe disability Modified Rankin: Moderately severe disability      PT Goals Acute Rehab PT Goals Time For Goal Achievement: 01/13/13 Potential to Achieve Goals: Good Pt will go Sit to Stand: with modified independence PT Goal: Sit to Stand - Progress: Progressing toward goal Pt will Transfer Bed to Chair/Chair to Bed: with modified independence Pt will Ambulate: >150 feet;with supervision;with least restrictive assistive device PT Goal: Ambulate - Progress: Progressing toward goal Pt will Go Up / Down Stairs: 3-5 stairs;with supervision;with rail(s)  Visit Information  Last PT Received On: 12/31/12 Assistance Needed: +1    Subjective Data  Patient Stated Goal: I'd like to be able to walk better   Cognition  Cognition Arousal/Alertness: Awake/alert Behavior During Therapy: WFL for tasks assessed/performed Overall Cognitive Status: Impaired/Different from baseline Area of Impairment: Safety/judgement Safety/Judgement: Decreased awareness of safety General Comments: Pt's wife states pt has been ambulating to bathroom by himself.      Balance     End of Session PT - End of Session Equipment Utilized During Treatment: Gait belt Activity Tolerance: Patient tolerated treatment well;Patient limited by fatigue Patient left: in chair;with call bell/phone within reach Nurse Communication: Mobility status     Verdell Face, [place] [phone] 12/31/2012

## 2012-12-31 NOTE — Progress Notes (Addendum)
Pt ambulating to & from bathroom w/ supervision of wife.  This, plus pt to have private insurance [if wife able to [employer]] but CIR unable to request authorization since insurance is unknown makes pt more appropriate for SNF, if not home.  Discussed w/ pt's CM, Brenda.  Will check on situation tomorrow.  [phone]

## 2012-12-31 NOTE — Progress Notes (Signed)
Stroke Team Progress Note  HISTORY Sean Myers is an 67 y.o. male the history of hypertension, hyperlipidemia and previous stroke in 2006 presenting with onset of unsteady gait with associated nausea and vomiting starting at about 1:30 yesterday 12/28/2012. He has not noticed focal weakness. Wife indicated he had dysarthria last night, which has resolved. He has had no swallowing difficulty. CT scan of his head showed age indeterminate bilateral cerebellar infarcts, possibly acute, as well as old right occipital infarction and multiple basal ganglia infarctions. NIH stroke score was 1 for facial asymmetry and possibly right lower facial weakness. Coordination was unremarkable. Patient admits to not being compliant with taking Plavix, as well as blood pressure medication. Patient was not a TPA candidate secondary to delay in arrival. He was admitted for further evaluation and treatment.  SUBJECTIVE Patient stable. No new events.  OBJECTIVE Most recent Vital Signs: Filed Vitals:   12/31/12 0014 12/31/12 0352 12/31/12 0810 12/31/12 1317  BP: 131/89 138/95 136/89 142/84  Pulse: 88 81 83 80  Temp: 98.2 F (36.8 C) 97.9 F (36.6 C)  98.6 F (37 C)  TempSrc: Oral Oral  Oral  Resp: 18 18 19 18   Height:      Weight:      SpO2: 97% 100% 97% 99%   CBG (last 3)   Recent Labs  12/30/12 2138 12/31/12 0719 12/31/12 1209  GLUCAP 103* 110* 174*    IV Fluid Intake:   . sodium chloride      MEDICATIONS  . amLODipine  10 mg Oral Daily  . clopidogrel  75 mg Oral Daily  . heparin  5,000 Units Subcutaneous Q8H  . nicotine  14 mg Transdermal Daily  . simvastatin  20 mg Oral q1800   PRN:  ondansetron  Diet:  NPO thin liquids Activity:   DVT Prophylaxis:  Heparin 5000 units sq tid   CLINICALLY SIGNIFICANT STUDIES Basic Metabolic Panel:   Recent Labs Lab 12/29/12 1505 12/29/12 2118  NA 143  --   K 3.4*  --   CL 99  --   CO2 31  --   GLUCOSE 124*  --   BUN 13  --   CREATININE  0.91 1.03  CALCIUM 10.5  --    Liver Function Tests:   Recent Labs Lab 12/29/12 1505  AST 24  ALT 9  ALKPHOS 93  BILITOT 0.8  PROT 9.3*  ALBUMIN 4.5   CBC:   Recent Labs Lab 12/29/12 1505 12/29/12 2118  WBC 7.5 7.9  NEUTROABS 4.7  --   HGB 19.1* 18.0*  HCT 53.1* 49.2  MCV 78.9 78.5  PLT 214 194   Coagulation:   Recent Labs Lab 12/29/12 1505  LABPROT 13.6  INR 1.05   Cardiac Enzymes:   Recent Labs Lab 12/29/12 1505 12/29/12 1731  TROPONINI 0.86* 0.99*   Urinalysis: No results found for this basename: COLORURINE, APPERANCEUR, LABSPEC, PHURINE, GLUCOSEU, HGBUR, BILIRUBINUR, KETONESUR, PROTEINUR, UROBILINOGEN, NITRITE, LEUKOCYTESUR,  in the last 168 hours Lipid Panel    Component Value Date/Time   CHOL 195 12/30/2012 0430   TRIG 88 12/30/2012 0430   HDL 45 12/30/2012 0430   CHOLHDL 4.3 12/30/2012 0430   VLDL 18 12/30/2012 0430   LDLCALC 132* 12/30/2012 0430   HgbA1C  Lab Results  Component Value Date   HGBA1C 5.7* 12/29/2012    Urine Drug Screen:   No results found for this basename: labopia,  cocainscrnur,  labbenz,  amphetmu,  thcu,  labbarb  Alcohol Level: No results found for this basename: ETH,  in the last 168 hours  CT of the brain  12/29/2012   1.  Age indeterminate to infarcts of the cerebellum bilaterally may be acute. 2.  The stable encephalomalacia of the right occipital lobe. 3.  A similar appearance of atrophy and multiple basil ganglia lacunar infarcts.  At least one infarct of the right caudate head is new since the prior study, but does not appear acute.   MRI of the brain  12/30/2012   Acute infarct mid to inferior aspect of the left cerebellum, inferior aspect of the right cerebellum and mid to inferior aspect of the vermis.  Please see above for additional findings.   MRA of the brain  12/30/2012   Intracranial atherosclerotic type changes more notable involving the posterior circulation   2D Echocardiogram  55% no source of emboli was  identified  Carotid Doppler  Bilateral: No evidence of hemodynamically significant internal carotid artery stenosis. Vertebral artery flow is antegrade.   CXR    EKG  sinus bradycardia.   Therapy Recommendations CIR  Physical Exam   Middle aged african [place] male not in distress.Awake alert. Afebrile. Head is nontraumatic. Neck is supple without bruit. Hearing is normal. Cardiac exam no murmur or gallop. Lungs are clear to auscultation. Distal pulses are well felt Neurological Exam : Neurological Exam Endo Awake  Alert oriented x 3. Normal speech and language.eye movements full without nystagmus.fundi were not visualized. Vision acuity and fields appear normal. Hearing is normal. Palatal movements are normal. Face symmetric. Tongue midline. Normal strength, tone, reflexes and coordination. Normal sensation. Gait deferred.  ASSESSMENT Mr. Sean Myers is a 67 y.o. male presenting with unsteady gait with associated nausea and vomiting 1 week after stopping taking plavix in patient with previous posterior infarcts in 2005. Imaging confirms a bilateral posterior circulation infarcts. Infarcts felt to be embolic secondary to unknown source.  On no antiplatelets prior to admission. Now on clopidogrel 75 mg orally every day for secondary stroke prevention. Patient with resultant ataxia and nausea. Work up underway.  Hyperlipidemia, LDL 132, on no statin PTA, on zocor 20mg  now, goal LDL < 100 Hx stroke 2005, posterior circulation Diabetes, HgbA1c 5.7, goal < 7.0  Hospital day # 2  TREATMENT/PLAN  Continue  clopidogrel 75 mg orally every day for secondary stroke prevention. TEE today.  If positive for PFO (patent foramen ovale), check bilateral lower extremity venous dopplers to rule out DVT as possible source of stroke. CIR Recommended  Gwendolyn Lima. Manson Passey, [facility], MBA, MHA [facility] Pager: [phone] 12/31/2012 2:13 PM

## 2012-12-31 NOTE — Progress Notes (Signed)
TEE not done today because pt ate food around 10am provided by his wife. TEE rescheduled for tomorrow at 0900Am. I reeducated pt and family about being NPO after midnight.

## 2013-01-01 ENCOUNTER — Encounter (HOSPITAL_COMMUNITY)
Admission: EM | Disposition: A | Discharge status: Home / Self Care | Payer: Self-pay | Source: Home / Self Care | Attending: Internal Medicine

## 2013-01-01 ENCOUNTER — Encounter (HOSPITAL_COMMUNITY): Payer: Self-pay | Admitting: Gastroenterology

## 2013-01-01 DIAGNOSIS — I6789 Other cerebrovascular disease: Secondary | ICD-10-CM

## 2013-01-01 DIAGNOSIS — Z8679 Personal history of other diseases of the circulatory system: Secondary | ICD-10-CM

## 2013-01-01 HISTORY — PX: TEE WITHOUT CARDIOVERSION: SHX5443

## 2013-01-01 LAB — GLUCOSE, CAPILLARY

## 2013-01-01 SURGERY — ECHOCARDIOGRAM, TRANSESOPHAGEAL
Anesthesia: Moderate Sedation

## 2013-01-01 MED ORDER — BUTAMBEN-TETRACAINE-BENZOCAINE 2-2-14 % EX AERO
INHALATION_SPRAY | CUTANEOUS | Status: DC | PRN
  Administered 2013-01-01: 2 via TOPICAL

## 2013-01-01 MED ORDER — NICOTINE 14 MG/24HR TD PT24: 1.0000 | MEDICATED_PATCH | TRANSDERMAL | Status: DC

## 2013-01-01 MED ORDER — MIDAZOLAM HCL 5 MG/ML IJ SOLN
INTRAMUSCULAR | Status: AC
  Filled 2013-01-01: qty 2

## 2013-01-01 MED ORDER — FENTANYL CITRATE 0.05 MG/ML IJ SOLN
INTRAMUSCULAR | Status: AC
  Filled 2013-01-01: qty 2

## 2013-01-01 MED ORDER — SIMVASTATIN 20 MG PO TABS: 20.0000 mg | ORAL_TABLET | Freq: Every day | ORAL | Status: DC

## 2013-01-01 MED ORDER — MIDAZOLAM HCL 10 MG/2ML IJ SOLN
INTRAMUSCULAR | Status: DC | PRN
  Administered 2013-01-01: 2 mg via INTRAVENOUS
  Administered 2013-01-01: 1 mg via INTRAVENOUS

## 2013-01-01 MED ORDER — CLOPIDOGREL BISULFATE 75 MG PO TABS: 75.0000 mg | ORAL_TABLET | Freq: Every day | ORAL | Status: DC

## 2013-01-01 MED ORDER — FENTANYL CITRATE 0.05 MG/ML IJ SOLN
INTRAMUSCULAR | Status: DC | PRN
  Administered 2013-01-01 (×2): 25 ug via INTRAVENOUS

## 2013-01-01 MED ORDER — FUROSEMIDE 10 MG/ML IJ SOLN
INTRAMUSCULAR | Status: AC
  Filled 2013-01-01: qty 4

## 2013-01-01 NOTE — Progress Notes (Signed)
Insurance issues still unresolved.  Note plans for [facility] f/up.  CIR will sign off.  [phone]

## 2013-01-01 NOTE — H&P (View-Only) (Signed)
[facility] PROGRESS NOTE  Sean Myers ZOX:[id number] DOB: [birth year] DOA: 12/29/2012 PCP: Thomos Lemons, DO  Assessment/Plan: 1. Acute bilateral posterior circulation infarcts: neurology on board and will follow rec's. -plavix for secondary prevention (issues of compliance with medication prior to admission) -TEE plan for tomorrow. -PT/OT recommending CIR -2-D echo w/o source of emboli  2. HTN: continue current medication and low sodium diet. Permissive HTN in setting of acute stroke  3. HLD: will change to zocor for better control, LDL 132  4. Tobacco abuse: counseling provided; will continue nicotine patch  5. Nausea and vomiting: most likely associated with dizziness from CVA. No further episodes reported. Will continue PRN antiemetics.  6. elevated troponin: no CP, no SOB, no EKG changes. 2-D echo w/o wall motion abnormalities and preserved EF.  7. delirium: will order seroquel; PRN haldol and bedside sitter. Constant reorientation.  8. Grade 1 diastolic dysfunction: due to uncontrolled HTN. Will control BP; continue low sodium diet. No symptoms of acute CHF.  DVT: heparin   Code Status: full Family Communication: wife at bedside Disposition Plan: to be determine; CIR consulted; if patient do not qualify for admission to inpatient rehab will d/c home with [facility] services.   Consultants:  neurology  Procedures:  See below for x-ray reports  Antibiotics: None  HPI/Subjective: Afebrile, no CP, no SOB. Feeling better overall. Patient is mild agitated and with acute delirium (per wife he has experienced same behavior on previous hospitalization)  Objective: Filed Vitals:   12/31/12 1317 12/31/12 1427 12/31/12 1635 12/31/12 2000  BP: 142/84 139/90 151/92 115/82  Pulse: 80 74 81 87  Temp: 98.6 F (37 C) 98.2 F (36.8 C) 98.7 F (37.1 C) 97.8 F (36.6 C)  TempSrc: Oral Oral Oral Oral  Resp: 18 19 18    Height:  5\' 11"  (1.803 m)    Weight:  75.297 kg (166 lb)     SpO2: 99%  97% 99%    Intake/Output Summary (Last 24 hours) at 12/31/12 2141 Last data filed at 12/31/12 1310  Gross per 24 hour  Intake    480 ml  Output      0 ml  Net    480 ml   Filed Weights   12/29/12 1949 12/31/12 1427  Weight: 75.3 kg (166 lb 0.1 oz) 75.297 kg (166 lb)    Exam:   General: mild agitation and confusion (delirium); afebrile  Cardiovascular: S1 and S2, no rubs or gallops; neg murmurs  Respiratory: CTA bilaterally  Abdomen: soft, NT, ND, positive BS  Musculoskeletal: no joint swelling or erythema  Data Reviewed: Basic Metabolic Panel:  Recent Labs Lab 12/29/12 1505 12/29/12 2118  NA 143  --   K 3.4*  --   CL 99  --   CO2 31  --   GLUCOSE 124*  --   BUN 13  --   CREATININE 0.91 1.03  CALCIUM 10.5  --    Liver Function Tests:  Recent Labs Lab 12/29/12 1505  AST 24  ALT 9  ALKPHOS 93  BILITOT 0.8  PROT 9.3*  ALBUMIN 4.5   CBC:  Recent Labs Lab 12/29/12 1505 12/29/12 2118  WBC 7.5 7.9  NEUTROABS 4.7  --   HGB 19.1* 18.0*  HCT 53.1* 49.2  MCV 78.9 78.5  PLT 214 194   Cardiac Enzymes:  Recent Labs Lab 12/29/12 1505 12/29/12 1731  TROPONINI 0.86* 0.99*   CBG:  Recent Labs Lab 12/30/12 1637 12/30/12 2138 12/31/12 0719 12/31/12 1209 12/31/12 1703  GLUCAP 135* 103* 110* 174* 151*    Studies: Mri Brain Without Contrast  12/30/2012  *RADIOLOGY REPORT*  Clinical Data:  Episode of vomiting and unsteady gait.  MRI BRAIN WITHOUT CONTRAST MRA HEAD WITHOUT CONTRAST  Technique: Multiplanar, multiecho pulse sequences of the brain and surrounding structures were obtained according to standard protocol without intravenous contrast.  Angiographic images of the head were obtained using MRA technique without contrast.  Comparison: 12/29/2012 CT.  MRI HEAD  Findings:  Acute infarct mid to inferior aspect of the left cerebellum, inferior aspect of the right cerebellum and mid to inferior aspect of the vermis.  There may be tiny  blood breakdown products along the peripheral aspect of the left cerebellar hemorrhage. Local mass effect.  Presently no significant compression of the fourth ventricle.  This will need to be monitored closely.  Remote infarcts involving the basal ganglia, thalamus and corona radiata bilaterally greater on the left.  Remote right occipital lobe infarct.  Small vessel disease type changes.  Global atrophy without hydrocephalus.  No intracranial hemorrhage separate from residual blood breakdown products associated with left caudate/lenticular nucleus remote infarct and questionable tiny hemorrhage along the peripheral aspect of the acute left cerebellar infarct.  No intracranial mass lesion detected on this unenhanced exam.  Paranasal sinus opacification.  Mild exophthalmos.  IMPRESSION: Acute infarct mid to inferior aspect of the left cerebellum, inferior aspect of the right cerebellum and mid to inferior aspect of the vermis.  Please see above for additional findings.  MRA HEAD  Findings: Nonvisualization PICA and AICA bilaterally.  No significant stenosis distal vertebral arteries.  Mild to moderate narrowing proximal to mid basilar artery. Moderate to marked narrowing distal basilar artery.  Prominent narrowing irregularity superior cerebral arteries greater left.  Moderate to marked tandem stenoses right posterior cerebral artery with poor delineation distal branches consistent with the patient's remote infarct.  Mild to moderate narrowing left posterior cerebral artery.  Anterior circulation without medium or large size vessel significant stenosis or occlusion.  Middle cerebral artery moderate to marked branch vessel irregularity with decreased number of visualized branch vessels consistent with remote infarcts.  Mild irregularity A1 segment right anterior cerebral artery and supraclinoid aspect of the internal carotid artery.  No aneurysm noted.  IMPRESSION: Intracranial atherosclerotic type changes more  notable involving the posterior circulation as detailed above.  This has been made a PRA call report utilizing dashboard call feature.   Original Report Authenticated By: Lacy Duverney, M.D.    Mr Mra Head/brain Wo Cm  12/30/2012  *RADIOLOGY REPORT*  Clinical Data:  Episode of vomiting and unsteady gait.  MRI BRAIN WITHOUT CONTRAST MRA HEAD WITHOUT CONTRAST  Technique: Multiplanar, multiecho pulse sequences of the brain and surrounding structures were obtained according to standard protocol without intravenous contrast.  Angiographic images of the head were obtained using MRA technique without contrast.  Comparison: 12/29/2012 CT.  MRI HEAD  Findings:  Acute infarct mid to inferior aspect of the left cerebellum, inferior aspect of the right cerebellum and mid to inferior aspect of the vermis.  There may be tiny blood breakdown products along the peripheral aspect of the left cerebellar hemorrhage. Local mass effect.  Presently no significant compression of the fourth ventricle.  This will need to be monitored closely.  Remote infarcts involving the basal ganglia, thalamus and corona radiata bilaterally greater on the left.  Remote right occipital lobe infarct.  Small vessel disease type changes.  Global atrophy without hydrocephalus.  No intracranial hemorrhage separate from  residual blood breakdown products associated with left caudate/lenticular nucleus remote infarct and questionable tiny hemorrhage along the peripheral aspect of the acute left cerebellar infarct.  No intracranial mass lesion detected on this unenhanced exam.  Paranasal sinus opacification.  Mild exophthalmos.  IMPRESSION: Acute infarct mid to inferior aspect of the left cerebellum, inferior aspect of the right cerebellum and mid to inferior aspect of the vermis.  Please see above for additional findings.  MRA HEAD  Findings: Nonvisualization PICA and AICA bilaterally.  No significant stenosis distal vertebral arteries.  Mild to moderate  narrowing proximal to mid basilar artery. Moderate to marked narrowing distal basilar artery.  Prominent narrowing irregularity superior cerebral arteries greater left.  Moderate to marked tandem stenoses right posterior cerebral artery with poor delineation distal branches consistent with the patient's remote infarct.  Mild to moderate narrowing left posterior cerebral artery.  Anterior circulation without medium or large size vessel significant stenosis or occlusion.  Middle cerebral artery moderate to marked branch vessel irregularity with decreased number of visualized branch vessels consistent with remote infarcts.  Mild irregularity A1 segment right anterior cerebral artery and supraclinoid aspect of the internal carotid artery.  No aneurysm noted.  IMPRESSION: Intracranial atherosclerotic type changes more notable involving the posterior circulation as detailed above.  This has been made a PRA call report utilizing dashboard call feature.   Original Report Authenticated By: Lacy Duverney, M.D.     Scheduled Meds: . amLODipine  10 mg Oral Daily  . clopidogrel  75 mg Oral Daily  . heparin  5,000 Units Subcutaneous Q8H  . nicotine  14 mg Transdermal Daily  . QUEtiapine  75 mg Oral Once  . simvastatin  20 mg Oral q1800   Continuous Infusions: . sodium chloride      Active Problems:   TOBACCO ABUSE   HYPERTENSION   DUODENAL ULCER, HX OF   Ataxia   Cerebellar stroke, acute    Time spent:> 30 minutes    Lillieann Pavlich  [facility] Pager [phone]. If 7PM-7AM, please contact night-coverage at [email or web address], password [facility] 12/31/2012, 9:41 PM  LOS: 2 days

## 2013-01-01 NOTE — Care Management Note (Signed)
    Page 1 of 2   01/01/2013     11:17:50 AM   CARE MANAGEMENT NOTE 01/01/2013  Patient:  Sean Myers, Sean Myers   Account Number:  [id number]  Date Initiated:  01/01/2013  Documentation initiated by:  GRAVES-BIGELOW,[place] Natarajan  Subjective/Objective Assessment:   Pt admitted  for Acute bilateral posterior circulation infarcts. Pt is from home with wife and she provides 24 hour supervision. Pt wil need HH services.     Action/Plan:   CM did speak to pt and the plan is for home with [facility] services. Wife chose [facility] for services and Referral was made.  SOC to begin within 24-48 hours post d/c.   Anticipated DC Date:  01/01/2013   Anticipated DC Plan:  HOME W HOME HEALTH SERVICES  In-house referral  Financial Counselor      DC Planning Services  CM consult      [facility] Choice  HOME HEALTH  DURABLE MEDICAL EQUIPMENT   Choice offered to / List presented to:  C-3 Spouse   DME arranged  Levan Hurst      DME agency  [facility]     [facility] arranged  HH-1 RN  HH-10 DISEASE MANAGEMENT  HH-2 PT  HH-6 SOCIAL WORKER      HH agency  [facility]   Status of service:  Completed, signed off Medicare Important Message given?   (If response is "NO", the following Medicare IM given date fields will be blank) Date Medicare IM given:   Date Additional Medicare IM given:    Discharge Disposition:  HOME W HOME HEALTH SERVICES  Per UR Regulation:  Reviewed for med. necessity/level of care/duration of stay  If discussed at Long Length of Stay Meetings, dates discussed:    Comments:  DME to be delivered to room before d/c RW. RN Maureen Ralphs aware of disposition plans. No further needs from CM at this time. Tomi Bamberger, RN BSN [phone]

## 2013-01-01 NOTE — Progress Notes (Signed)
*  PRELIMINARY RESULTS* Vascular Ultrasound Lower extremtiy venous duplex has been completed.  Preliminary findings: Bilaterally no evidence of DVT.  Farrel Demark, RDMS, RVT  01/01/2013, 2:24 PM

## 2013-01-01 NOTE — Discharge Summary (Signed)
Physician Discharge Summary  Sean Myers ZOX:[id number] DOB: 1946-02-27 DOA: 12/29/2012  PCP: Thomos Lemons, DO  Admit date: 12/29/2012 Discharge date: 01/01/2013  Time spent: >30 minutes  Recommendations for Outpatient Follow-up:  1. BMET to follow electrolytes and kidney function 2. Reassess BP and adjust medications for further control if needed 3. Encourage medication compliance.  Discharge Diagnoses:  Active Problems:   TOBACCO ABUSE   HYPERTENSION   DUODENAL ULCER, HX OF   Ataxia   Cerebellar stroke, acute Grade 1 diastolic dysfunction   Discharge Condition: stable and improved. Will discharge home with [facility] services and rolling walker for DME. Follow up with PCP in 2 weeks  Diet recommendation: heart healthy diet  Filed Weights   12/29/12 1949 12/31/12 1427 01/01/13 0400  Weight: 75.3 kg (166 lb 0.1 oz) 75.297 kg (166 lb) 75.297 kg (166 lb)    History of present illness:  67 y.o. male the history of hypertension, hyperlipidemia and previous stroke in 2006 presenting with onset of unsteady gait with associated nausea and vomiting starting at about 1:30 yesterday. He has not noticed focal weakness. Wife indicated he had dysarthria last night, which has resolved. He has had no swallowing difficulty. CT scan of his head showed age indeterminate bilateral cerebellar infarcts, possibly acute, as well as old right occipital infarction and multiple basal ganglia infarctions. NIH stroke score was 1 for facial asymmetry and possibly right lower facial weakness. Coordination was unremarkable. Patient admits to not being compliant with taking Plavix, as well as blood pressure medication.  Patient reports non-compliance with medications prior to admission.   Hospital Course:  1. Acute bilateral posterior circulation infarcts: per neurology recommendations.  -plavix for secondary prevention (issues of compliance with medication prior to admission)  -TEE demonstrated small PFO; but his LE  dopplers show no DVT -PT/OT recommended CIR; but due to insurance issues patient unable to qualify; after discussing with patient and family member decision was taken for patient to be discharge home with [facility] services (PT/OT,RN, Aid, SW and rolling walker) -will continue statins and controlling BP  2. HTN: continue current medication and low sodium diet. Follow up with PCP in 2 weeks to reassess BP and adjust medications if needed.  3. HLD: started on zocor for better control, LDL 132   4. Tobacco abuse: counseling provided; will dischar and encourage to continue nicotine patch   5. Nausea and vomiting: most likely associated with dizziness from CVA. No further episodes reported.   6. elevated troponin: no CP, no SOB, no EKG changes. 2-D echo w/o wall motion abnormalities and preserved EF. Grade 1 diastolic dysfunction as mentioned below.  7. delirium: will order seroquel; PRN haldol and bedside sitter. Constant reorientation.   8. Grade 1 diastolic dysfunction: due to uncontrolled HTN. Will control BP; continue low sodium diet. No symptoms of acute CHF.    Procedures: 2-D echo (no wall motion abnormalities, EF 55-60%, grade 1 diastolic dysfunction) TEE (positive for small PFO, no thrombi or significant valvular abnormalities) LE dopplers (negative for DVT)  Consultations:  Cardiology for TEE  Neurology  PT/OT  CIR  Discharge Exam: Filed Vitals:   01/01/13 0950 01/01/13 1000 01/01/13 1010 01/01/13 1441  BP: 111/72 107/75 124/74 137/84  Pulse:    61  Temp:    97.8 F (36.6 C)  TempSrc:    Oral  Resp: 14 15 15 18   Height:      Weight:      SpO2: 98% 99% 100% 96%   General:  mild agitation and confusion (delirium); afebrile  Cardiovascular: S1 and S2, no rubs or gallops; neg murmurs  Respiratory: CTA bilaterally  Abdomen: soft, NT, ND, positive BS  Musculoskeletal: no joint swelling or erythema   Discharge Instructions  Discharge Orders   Future Appointments  Provider Department Dept Phone   05/19/2013 2:15 PM Doe-Hyun Sherran Needs, DO [facility] [phone]   Future Orders Complete By Expires     Diet - low sodium heart healthy  As directed     Discharge instructions  As directed     Comments:      Follow a heart healthy diet Take medications as prescribed Follow up with PCP in 2 weeks Stop smoking Keep yourself hydrated    Increase activity slowly  As directed     Walker rolling  As directed         Medication List    TAKE these medications       ACCU-CHEK AVIVA test strip  Generic drug:  glucose blood  Use once a day to check blood sugar     accu-chek multiclix lancets  Use once a day to check blood sugar     amLODipine 10 MG tablet  Commonly known as:  NORVASC  Take 1 tablet (10 mg total) by mouth daily.     clopidogrel 75 MG tablet  Commonly known as:  PLAVIX  Take 1 tablet (75 mg total) by mouth daily.     nicotine 14 mg/24hr patch  Commonly known as:  NICODERM CQ - dosed in mg/24 hours  Place 1 patch onto the skin daily.     ondansetron 4 MG tablet  Commonly known as:  ZOFRAN  Take 1 tablet (4 mg total) by mouth every 8 (eight) hours as needed for nausea.     sildenafil 100 MG tablet  Commonly known as:  VIAGRA  Take 100 mg by mouth daily as needed for erectile dysfunction.     simvastatin 20 MG tablet  Commonly known as:  ZOCOR  Take 1 tablet (20 mg total) by mouth daily at 6 PM.       Allergies  Allergen Reactions  . Poliovirus Vaccine Inactivated     Heart races, cold sweats, "almost killed me".  . Aspirin     Gi upset  . Penicillins Swelling, Palpitations and Rash       Follow-up Information   Follow up with Thomos Lemons, DO. Schedule an appointment as soon as possible for a visit in 2 weeks.   Contact information:   [address] Christena Flake [place] [address] [phone]       The results of significant diagnostics from this hospitalization (including imaging, microbiology,  ancillary and laboratory) are listed below for reference.    Significant Diagnostic Studies: Ct Head (brain) Wo Contrast  12/29/2012  *RADIOLOGY REPORT*  Clinical Data: Lightheaded and unsteady gait.  CT HEAD WITHOUT CONTRAST  Technique:  Contiguous axial images were obtained from the base of the skull through the vertex without contrast.  Comparison: CT head without contrast 07/29/2011.  Findings: Remote encephalomalacia of the right occipital lobe is again noted.  Multiple remote lacunar infarcts are again noted in the basal ganglia bilaterally.  Least one infarct in the right caudate head is new since the prior exam, but does appear remote. Areas of hypoattenuation in the cerebellum bilaterally are also new.  These are age indeterminate and may represent acute infarcts.  No hemorrhage or mass lesion is evident.  The ventricles  are of normal size.  No significant extra-axial fluid collection is present.  The paranasal sinuses and mastoid air cells are clear.  The osseous skull is intact.  IMPRESSION:  1.  Age indeterminate to infarcts of the cerebellum bilaterally may be acute. 2.  The stable encephalomalacia of the right occipital lobe. 3.  A similar appearance of atrophy and multiple basil ganglia lacunar infarcts.  At least one infarct of the right caudate head is new since the prior study, but does not appear acute.   Original Report Authenticated By: Marin Roberts, M.D.    Mri Brain Without Contrast  12/30/2012  *RADIOLOGY REPORT*  Clinical Data:  Episode of vomiting and unsteady gait.  MRI BRAIN WITHOUT CONTRAST MRA HEAD WITHOUT CONTRAST  Technique: Multiplanar, multiecho pulse sequences of the brain and surrounding structures were obtained according to standard protocol without intravenous contrast.  Angiographic images of the head were obtained using MRA technique without contrast.  Comparison: 12/29/2012 CT.  MRI HEAD  Findings:  Acute infarct mid to inferior aspect of the left cerebellum,  inferior aspect of the right cerebellum and mid to inferior aspect of the vermis.  There may be tiny blood breakdown products along the peripheral aspect of the left cerebellar hemorrhage. Local mass effect.  Presently no significant compression of the fourth ventricle.  This will need to be monitored closely.  Remote infarcts involving the basal ganglia, thalamus and corona radiata bilaterally greater on the left.  Remote right occipital lobe infarct.  Small vessel disease type changes.  Global atrophy without hydrocephalus.  No intracranial hemorrhage separate from residual blood breakdown products associated with left caudate/lenticular nucleus remote infarct and questionable tiny hemorrhage along the peripheral aspect of the acute left cerebellar infarct.  No intracranial mass lesion detected on this unenhanced exam.  Paranasal sinus opacification.  Mild exophthalmos.  IMPRESSION: Acute infarct mid to inferior aspect of the left cerebellum, inferior aspect of the right cerebellum and mid to inferior aspect of the vermis.  Please see above for additional findings.  MRA HEAD  Findings: Nonvisualization PICA and AICA bilaterally.  No significant stenosis distal vertebral arteries.  Mild to moderate narrowing proximal to mid basilar artery. Moderate to marked narrowing distal basilar artery.  Prominent narrowing irregularity superior cerebral arteries greater left.  Moderate to marked tandem stenoses right posterior cerebral artery with poor delineation distal branches consistent with the patient's remote infarct.  Mild to moderate narrowing left posterior cerebral artery.  Anterior circulation without medium or large size vessel significant stenosis or occlusion.  Middle cerebral artery moderate to marked branch vessel irregularity with decreased number of visualized branch vessels consistent with remote infarcts.  Mild irregularity A1 segment right anterior cerebral artery and supraclinoid aspect of the internal  carotid artery.  No aneurysm noted.  IMPRESSION: Intracranial atherosclerotic type changes more notable involving the posterior circulation as detailed above.  This has been made a PRA call report utilizing dashboard call feature.   Original Report Authenticated By: Lacy Duverney, M.D.    Mr Mra Head/brain Wo Cm  12/30/2012  *RADIOLOGY REPORT*  Clinical Data:  Episode of vomiting and unsteady gait.  MRI BRAIN WITHOUT CONTRAST MRA HEAD WITHOUT CONTRAST  Technique: Multiplanar, multiecho pulse sequences of the brain and surrounding structures were obtained according to standard protocol without intravenous contrast.  Angiographic images of the head were obtained using MRA technique without contrast.  Comparison: 12/29/2012 CT.  MRI HEAD  Findings:  Acute infarct mid to inferior aspect of the left cerebellum,  inferior aspect of the right cerebellum and mid to inferior aspect of the vermis.  There may be tiny blood breakdown products along the peripheral aspect of the left cerebellar hemorrhage. Local mass effect.  Presently no significant compression of the fourth ventricle.  This will need to be monitored closely.  Remote infarcts involving the basal ganglia, thalamus and corona radiata bilaterally greater on the left.  Remote right occipital lobe infarct.  Small vessel disease type changes.  Global atrophy without hydrocephalus.  No intracranial hemorrhage separate from residual blood breakdown products associated with left caudate/lenticular nucleus remote infarct and questionable tiny hemorrhage along the peripheral aspect of the acute left cerebellar infarct.  No intracranial mass lesion detected on this unenhanced exam.  Paranasal sinus opacification.  Mild exophthalmos.  IMPRESSION: Acute infarct mid to inferior aspect of the left cerebellum, inferior aspect of the right cerebellum and mid to inferior aspect of the vermis.  Please see above for additional findings.  MRA HEAD  Findings: Nonvisualization PICA and  AICA bilaterally.  No significant stenosis distal vertebral arteries.  Mild to moderate narrowing proximal to mid basilar artery. Moderate to marked narrowing distal basilar artery.  Prominent narrowing irregularity superior cerebral arteries greater left.  Moderate to marked tandem stenoses right posterior cerebral artery with poor delineation distal branches consistent with the patient's remote infarct.  Mild to moderate narrowing left posterior cerebral artery.  Anterior circulation without medium or large size vessel significant stenosis or occlusion.  Middle cerebral artery moderate to marked branch vessel irregularity with decreased number of visualized branch vessels consistent with remote infarcts.  Mild irregularity A1 segment right anterior cerebral artery and supraclinoid aspect of the internal carotid artery.  No aneurysm noted.  IMPRESSION: Intracranial atherosclerotic type changes more notable involving the posterior circulation as detailed above.  This has been made a PRA call report utilizing dashboard call feature.   Original Report Authenticated By: Lacy Duverney, M.D.     Labs: Basic Metabolic Panel:  Recent Labs Lab 12/29/12 1505 12/29/12 2118  NA 143  --   K 3.4*  --   CL 99  --   CO2 31  --   GLUCOSE 124*  --   BUN 13  --   CREATININE 0.91 1.03  CALCIUM 10.5  --    Liver Function Tests:  Recent Labs Lab 12/29/12 1505  AST 24  ALT 9  ALKPHOS 93  BILITOT 0.8  PROT 9.3*  ALBUMIN 4.5   CBC:  Recent Labs Lab 12/29/12 1505 12/29/12 2118  WBC 7.5 7.9  NEUTROABS 4.7  --   HGB 19.1* 18.0*  HCT 53.1* 49.2  MCV 78.9 78.5  PLT 214 194   Cardiac Enzymes:  Recent Labs Lab 12/29/12 1505 12/29/12 1731  TROPONINI 0.86* 0.99*   CBG:  Recent Labs Lab 12/31/12 1209 12/31/12 1703 12/31/12 1950 01/01/13 0839 01/01/13 1129  GLUCAP 174* 151* 176* 105* 113*    Signed:  Timmy Myers  [facility] 01/01/2013, 3:30 PM

## 2013-01-01 NOTE — CV Procedure (Signed)
Procedure: TEE  Indication: CVA  Sedation: Versed 3 mg IV, Fentanyl 50 mcg IV  Findings: Please see echo section for full report.  Normal LV size with mild LVH.  EF 55-60%.  Normal RV size and systolic function.  No significant valvular abnormalities.  No LAA thrombus.  No significant aortic plaque.  Small PFO, positive bubbles.   Potential source of embolus = small PFO.   No complications.   Marca Ancona 01/01/2013 9:45 AM

## 2013-01-01 NOTE — Progress Notes (Signed)
Stroke Team Progress Note  HISTORY Sean Myers is an 67 y.o. male the history of hypertension, hyperlipidemia and previous stroke in 2006 presenting with onset of unsteady gait with associated nausea and vomiting starting at about 1:30 yesterday 12/28/2012. He has not noticed focal weakness. Wife indicated he had dysarthria last night, which has resolved. He has had no swallowing difficulty. CT scan of his head showed age indeterminate bilateral cerebellar infarcts, possibly acute, as well as old right occipital infarction and multiple basal ganglia infarctions. NIH stroke score was 1 for facial asymmetry and possibly right lower facial weakness. Coordination was unremarkable. Patient admits to not being compliant with taking Plavix, as well as blood pressure medication. Patient was not a TPA candidate secondary to delay in arrival. He was admitted for further evaluation and treatment.  SUBJECTIVE Patient stable. No new events.  OBJECTIVE Most recent Vital Signs: Filed Vitals:   01/01/13 0950 01/01/13 1000 01/01/13 1010 01/01/13 1441  BP: 111/72 107/75 124/74 137/84  Pulse:    61  Temp:    97.8 F (36.6 C)  TempSrc:    Oral  Resp: 14 15 15 18   Height:      Weight:      SpO2: 98% 99% 100% 96%   CBG (last 3)   Recent Labs  12/31/12 1950 01/01/13 0839 01/01/13 1129  GLUCAP 176* 105* 113*    IV Fluid Intake:   . sodium chloride 500 mL (01/01/13 0817)    MEDICATIONS  . amLODipine  10 mg Oral Daily  . clopidogrel  75 mg Oral Daily  . furosemide      . heparin  5,000 Units Subcutaneous Q8H  . nicotine  14 mg Transdermal Daily  . QUEtiapine  75 mg Oral Once  . simvastatin  20 mg Oral q1800   PRN:  haloperidol lactate, ondansetron  Diet:  Cardiac thin liquids Activity:  Bathroom privileges with assist DVT Prophylaxis:  Heparin 5000 units sq tid   CLINICALLY SIGNIFICANT STUDIES Basic Metabolic Panel:   Recent Labs Lab 12/29/12 1505 12/29/12 2118  NA 143  --   K 3.4*   --   CL 99  --   CO2 31  --   GLUCOSE 124*  --   BUN 13  --   CREATININE 0.91 1.03  CALCIUM 10.5  --    Liver Function Tests:   Recent Labs Lab 12/29/12 1505  AST 24  ALT 9  ALKPHOS 93  BILITOT 0.8  PROT 9.3*  ALBUMIN 4.5   CBC:   Recent Labs Lab 12/29/12 1505 12/29/12 2118  WBC 7.5 7.9  NEUTROABS 4.7  --   HGB 19.1* 18.0*  HCT 53.1* 49.2  MCV 78.9 78.5  PLT 214 194   Coagulation:   Recent Labs Lab 12/29/12 1505  LABPROT 13.6  INR 1.05   Cardiac Enzymes:   Recent Labs Lab 12/29/12 1505 12/29/12 1731  TROPONINI 0.86* 0.99*   Urinalysis: No results found for this basename: COLORURINE, APPERANCEUR, LABSPEC, PHURINE, GLUCOSEU, HGBUR, BILIRUBINUR, KETONESUR, PROTEINUR, UROBILINOGEN, NITRITE, LEUKOCYTESUR,  in the last 168 hours Lipid Panel    Component Value Date/Time   CHOL 195 12/30/2012 0430   TRIG 88 12/30/2012 0430   HDL 45 12/30/2012 0430   CHOLHDL 4.3 12/30/2012 0430   VLDL 18 12/30/2012 0430   LDLCALC 132* 12/30/2012 0430   HgbA1C  Lab Results  Component Value Date   HGBA1C 5.7* 12/29/2012    Urine Drug Screen:   No results found for  this basename: labopia,  cocainscrnur,  labbenz,  amphetmu,  thcu,  labbarb    Alcohol Level: No results found for this basename: ETH,  in the last 168 hours  CT of the brain  12/29/2012   1.  Age indeterminate to infarcts of the cerebellum bilaterally may be acute. 2.  The stable encephalomalacia of the right occipital lobe. 3.  A similar appearance of atrophy and multiple basil ganglia lacunar infarcts.  At least one infarct of the right caudate head is new since the prior study, but does not appear acute.   MRI of the brain  12/30/2012   Acute infarct mid to inferior aspect of the left cerebellum, inferior aspect of the right cerebellum and mid to inferior aspect of the vermis.  Please see above for additional findings.   MRA of the brain  12/30/2012   Intracranial atherosclerotic type changes more notable involving the  posterior circulation   2D Echocardiogram  55% no source of emboli was identified  Carotid Doppler  Bilateral: No evidence of hemodynamically significant internal carotid artery stenosis. Vertebral artery flow is antegrade.   TEE: small PFO  LE Dopplers: no dvt  CXR    EKG  sinus bradycardia.   Therapy Recommendations CIR  Physical Exam   Middle aged african [place] male not in distress.Awake alert. Afebrile. Head is nontraumatic. Neck is supple without bruit. Hearing is normal. Cardiac exam no murmur or gallop. Lungs are clear to auscultation. Distal pulses are well felt Neurological Exam : Neurological Exam Endo Awake  Alert oriented x 3. Normal speech and language.eye movements full without nystagmus.fundi were not visualized. Vision acuity and fields appear normal. Hearing is normal. Palatal movements are normal. Face symmetric. Tongue midline. Normal strength, tone, reflexes and coordination. Normal sensation. Gait deferred.  ASSESSMENT Mr. Sean Myers is a 67 y.o. male presenting with unsteady gait with associated nausea and vomiting 1 week after stopping taking plavix in patient with previous posterior infarcts in 2005. Imaging confirms a bilateral posterior circulation infarcts. Infarcts felt to be embolic secondary to unknown source.  On no antiplatelets prior to admission. Now on clopidogrel 75 mg orally every day for secondary stroke prevention. Patient with resultant ataxia and nausea. Work up underway.  Hyperlipidemia, LDL 132, on no statin PTA, on zocor 20mg  now, goal LDL < 100 Hx stroke 2005, posterior circulation Diabetes, HgbA1c 5.7, goal < 7.0  Hospital day # 3  TREATMENT/PLAN  Continue  clopidogrel 75 mg orally every day for secondary stroke prevention. TEE small pfo, LE dopplers with no DVT Ok for discharge (home health services documented) Have patient follow-up with Dr. Pearlean Brownie in 2 months. Risk factor modification Have patient follow-up with PCP within  2-3 weeks.  Gwendolyn Lima. Manson Passey, [facility], MBA, MHA [facility] Pager: [phone] 01/01/2013 2:44 PM I have examined this patient, reviewed plan of care and agree with the above Delia Heady, MD

## 2013-01-01 NOTE — Progress Notes (Signed)
OT Cancellation Note  Patient Details Name: Sean Myers MRN: [id number] DOB: Feb 07, 1946   Cancelled Treatment:    Reason Eval/Treat Not Completed: Patient at procedure or test/ unavailable (currently off floor at endo) OT to reattempt evaluation at later date/ time.  Harrel Carina [facility] 01/01/2013, 10:11 AM Pager: [phone]

## 2013-01-01 NOTE — Interval H&P Note (Signed)
History and Physical Interval Note:  01/01/2013 9:29 AM  Early Chars  has presented today for surgery, with the diagnosis of stroke  The various methods of treatment have been discussed with the patient and family. After consideration of risks, benefits and other options for treatment, the patient has consented to  Procedure(s): TRANSESOPHAGEAL ECHOCARDIOGRAM (TEE) (N/A) as a surgical intervention .  The patient's history has been reviewed, patient examined, no change in status, stable for surgery.  I have reviewed the patient's chart and labs.  Questions were answered to the patient's satisfaction.     Traeger Sultana [employer]

## 2013-01-01 NOTE — Progress Notes (Signed)
  Echocardiogram Echocardiogram Transesophageal has been performed.  Alisea Matte FRANCES 01/01/2013, 10:01 AM

## 2013-01-01 NOTE — Progress Notes (Signed)
PT Cancellation Note  Patient Details Name: SOFIA JAQUITH MRN: [id number] DOB: 24-Feb-1946   Cancelled Treatment:    Reason Eval/Treat Not Completed: Other (comment) (In am, pt in endo; in pm pt in vascular lab)   INGOLD,Maicy Filip 01/01/2013, 3:26 PM [facility] [phone] (pager)

## 2013-01-04 ENCOUNTER — Encounter (HOSPITAL_COMMUNITY): Payer: Self-pay | Admitting: Cardiology

## 2013-01-08 DIAGNOSIS — I69998 Other sequelae following unspecified cerebrovascular disease: Secondary | ICD-10-CM | POA: Diagnosis not present

## 2013-01-08 DIAGNOSIS — M6281 Muscle weakness (generalized): Secondary | ICD-10-CM | POA: Diagnosis not present

## 2013-01-08 DIAGNOSIS — R269 Unspecified abnormalities of gait and mobility: Secondary | ICD-10-CM | POA: Diagnosis not present

## 2013-01-08 DIAGNOSIS — E119 Type 2 diabetes mellitus without complications: Secondary | ICD-10-CM | POA: Diagnosis not present

## 2013-01-14 ENCOUNTER — Ambulatory Visit (INDEPENDENT_AMBULATORY_CARE_PROVIDER_SITE_OTHER): Payer: Medicare Other | Admitting: Internal Medicine

## 2013-01-14 ENCOUNTER — Telehealth: Payer: Self-pay | Admitting: Internal Medicine

## 2013-01-14 ENCOUNTER — Encounter: Payer: Self-pay | Admitting: Internal Medicine

## 2013-01-14 VITALS — BP 142/90 | HR 84 | Temp 98.0°F | Wt 172.0 lb

## 2013-01-14 DIAGNOSIS — F329 Major depressive disorder, single episode, unspecified: Secondary | ICD-10-CM

## 2013-01-14 DIAGNOSIS — I639 Cerebral infarction, unspecified: Secondary | ICD-10-CM

## 2013-01-14 DIAGNOSIS — I10 Essential (primary) hypertension: Secondary | ICD-10-CM

## 2013-01-14 MED ORDER — AMLODIPINE BESYLATE 10 MG PO TABS: 10.0000 mg | ORAL_TABLET | Freq: Every day | ORAL | Status: DC

## 2013-01-14 MED ORDER — SIMVASTATIN 20 MG PO TABS: 20.0000 mg | ORAL_TABLET | Freq: Every day | ORAL | Status: DC

## 2013-01-14 MED ORDER — VARENICLINE TARTRATE 1 MG PO TABS: 1.0000 mg | ORAL_TABLET | Freq: Two times a day (BID) | ORAL | Status: DC

## 2013-01-14 MED ORDER — SERTRALINE HCL 25 MG PO TABS: 25.0000 mg | ORAL_TABLET | Freq: Every day | ORAL | Status: DC

## 2013-01-14 MED ORDER — VARENICLINE TARTRATE 0.5 MG X 11 & 1 MG X 42 PO MISC: ORAL | Status: DC

## 2013-01-14 NOTE — Assessment & Plan Note (Addendum)
Restart sertraline 25 mg once daily.  Consider referral to psychiatrist if persistent depressive symptoms.

## 2013-01-14 NOTE — Assessment & Plan Note (Signed)
66 year old [place] American male recently admitted for ataxia, nausea vomiting and slurred speech. Patient found to have subacute bilateral cerebellar stroke. I stressed importance of abstaining from tobacco use. Discontinue nicotine patch. Use Chantix as directed.

## 2013-01-14 NOTE — Progress Notes (Signed)
Subjective:    Patient ID: Sean Myers, male    DOB: [birth year], 67 y.o.   MRN: [id number]  HPI  67 year old African American male for hospital followup. Patient admitted on 12/29/2012 with symptoms of acute ataxia, nausea / vomiting and slurred speech. CT scan of head showed age indeterminate bilateral cerebellar infarcts. Patient admitted to hospitalist that he was not being compliant with taking Plavix or his blood pressure medication. Patient underwent a TEE demonstrating small PFO but his lower extremity Dopplers were negative for DVT. Patient continues to work with physical therapist and occupational therapist.  Patient started on nicotine patch on discharge. His wife reports patient has restarted smoking.  She is very upset by this.  Fortunately his nausea and vomiting resolved.  2-D echo EF 55-60%. Grade 1 diastolic dysfunction.  Review of Systems Mild memory loss, negative for falls.  Patient accompanied by supportive wife. She has noticed depressive symptoms   Past Medical History  Diagnosis Date  . ED (erectile dysfunction)   . Depression   . Hyperlipidemia   . History of cerebrovascular accident   . Diabetes mellitus, type II   . Stroke     History   Social History  . Marital Status: Married    Spouse Name: N/A    Number of Children: N/A  . Years of Education: N/A   Occupational History  . Not on file.   Social History Main Topics  . Smoking status: Current Every Day Smoker -- 1.00 packs/day  . Smokeless tobacco: Not on file  . Alcohol Use: No  . Drug Use: Yes    Special: Marijuana     Comment: marijuana  . Sexually Active: Not on file   Other Topics Concern  . Not on file   Social History Narrative   Retired   Married   Current Smoker    Alcohol use-no         son Lavone Neri living in [place] ([place]) -  engaged to be married    Past Surgical History  Procedure Laterality Date  . Colonoscopy  1990's    [facility]  . Tee without  cardioversion N/A 01/01/2013    Procedure: TRANSESOPHAGEAL ECHOCARDIOGRAM (TEE);  Surgeon: Laurey Morale, MD;  Location: [facility] ENDOSCOPY;  Service: Cardiovascular;  Laterality: N/A;    Family History  Problem Relation Age of Onset  . Heart disease Mother   . Other      no family history of colon cancer    Allergies  Allergen Reactions  . Poliovirus Vaccine Inactivated     Heart races, cold sweats, "almost killed me".  . Aspirin     Gi upset  . Penicillins Swelling, Palpitations and Rash    Current Outpatient Prescriptions on File Prior to Visit  Medication Sig Dispense Refill  . clopidogrel (PLAVIX) 75 MG tablet Take 1 tablet (75 mg total) by mouth daily.  90 tablet  1  . glucose blood (ACCU-CHEK AVIVA) test strip Use once a day to check blood sugar       . Lancets (ACCU-CHEK MULTICLIX) lancets Use once a day to check blood sugar       . ondansetron (ZOFRAN) 4 MG tablet Take 1 tablet (4 mg total) by mouth every 8 (eight) hours as needed for nausea.  30 tablet  0  . sildenafil (VIAGRA) 100 MG tablet Take 100 mg by mouth daily as needed for erectile dysfunction.       No current facility-administered medications on file prior  to visit.    BP 142/90  Pulse 84  Temp(Src) 98 F (36.7 C) (Oral)  Wt 172 lb (78.019 kg)  BMI 24 kg/m2    Objective:   Physical Exam  Constitutional: He is oriented to person, place, and time. He appears well-developed and well-nourished.  HENT:  Head: Normocephalic and atraumatic.  Right Ear: External ear normal.  Mouth/Throat: Oropharynx is clear and moist.  Cardiovascular: Normal rate, regular rhythm and normal heart sounds.   No murmur heard. Pulmonary/Chest: Effort normal and breath sounds normal.  Musculoskeletal: He exhibits no edema.  Neurological: He is alert and oriented to person, place, and time. No cranial nerve deficit.  Ambulates with single prong cane  Skin: Skin is warm and dry.  Psychiatric: He has a normal mood and affect. His  behavior is normal.          Assessment & Plan:

## 2013-01-14 NOTE — Telephone Encounter (Signed)
[facility] is DC-ing him for now. State pt is not ready to hear the information re his newest stroke.  Amber had a heart-to-heart with pt. Per that talk, she feel's that pt is going through depression - failure to thrive. Wife agrees - pt agrees he is not himself - getting up, dresses, life, is a struggle. No appetite. She wanted you to know this before pt's appt today at 3pm.  If things change, please let [facility] know if services are needed.

## 2013-01-14 NOTE — Assessment & Plan Note (Signed)
Continue same dose of amlodipine.

## 2013-02-11 ENCOUNTER — Ambulatory Visit: Payer: Medicare Other | Admitting: Internal Medicine

## 2013-02-12 DIAGNOSIS — I10 Essential (primary) hypertension: Secondary | ICD-10-CM | POA: Diagnosis not present

## 2013-02-12 DIAGNOSIS — I6350 Cerebral infarction due to unspecified occlusion or stenosis of unspecified cerebral artery: Secondary | ICD-10-CM

## 2013-02-12 DIAGNOSIS — F329 Major depressive disorder, single episode, unspecified: Secondary | ICD-10-CM | POA: Diagnosis not present

## 2013-03-09 ENCOUNTER — Ambulatory Visit: Payer: Self-pay | Admitting: Internal Medicine

## 2013-03-31 ENCOUNTER — Other Ambulatory Visit: Payer: Self-pay

## 2013-04-01 ENCOUNTER — Ambulatory Visit (INDEPENDENT_AMBULATORY_CARE_PROVIDER_SITE_OTHER): Payer: Medicare Other | Admitting: Internal Medicine

## 2013-04-01 ENCOUNTER — Encounter: Payer: Self-pay | Admitting: Internal Medicine

## 2013-04-01 VITALS — BP 140/90 | HR 64 | Temp 98.2°F | Wt 178.0 lb

## 2013-04-01 DIAGNOSIS — I10 Essential (primary) hypertension: Secondary | ICD-10-CM | POA: Diagnosis not present

## 2013-04-01 DIAGNOSIS — I6350 Cerebral infarction due to unspecified occlusion or stenosis of unspecified cerebral artery: Secondary | ICD-10-CM | POA: Diagnosis not present

## 2013-04-01 DIAGNOSIS — E785 Hyperlipidemia, unspecified: Secondary | ICD-10-CM | POA: Diagnosis not present

## 2013-04-01 DIAGNOSIS — F329 Major depressive disorder, single episode, unspecified: Secondary | ICD-10-CM

## 2013-04-01 DIAGNOSIS — I639 Cerebral infarction, unspecified: Secondary | ICD-10-CM

## 2013-04-01 DIAGNOSIS — E8809 Other disorders of plasma-protein metabolism, not elsewhere classified: Secondary | ICD-10-CM

## 2013-04-01 MED ORDER — SIMVASTATIN 20 MG PO TABS: 20.0000 mg | ORAL_TABLET | Freq: Every day | ORAL | Status: DC

## 2013-04-01 MED ORDER — LOSARTAN POTASSIUM 25 MG PO TABS: 25.0000 mg | ORAL_TABLET | Freq: Every day | ORAL | Status: DC

## 2013-04-01 NOTE — Assessment & Plan Note (Signed)
Blood pressure is suboptimally controlled. Add low-dose losartan 25 mg. Arrange followup electrolytes and kidney function in one month. BP: 140/90 mmHg  Lab Results  Component Value Date   CREATININE 1.03 12/29/2012

## 2013-04-01 NOTE — Assessment & Plan Note (Signed)
Patient has recovered nicely. He has minimal gait issues. He has occasional vertigo.  Fortunately patient able to discontinue smoking with use of Chantix. Continue aggressive risk factor management.

## 2013-04-01 NOTE — Assessment & Plan Note (Signed)
Stable.  Continue same dose of simvastatin.

## 2013-04-01 NOTE — Patient Instructions (Signed)
Monitor your blood pressure at home. Return in 1 month for blood work: BMET - 401.9

## 2013-04-01 NOTE — Assessment & Plan Note (Signed)
Good response to sertraline. Maintain same dose.  I encouraged medication compliance.

## 2013-04-01 NOTE — Progress Notes (Signed)
Subjective:    Patient ID: Sean Myers, male    DOB: 01-23-1946, 67 y.o.   MRN: [id number]  HPI  67 year old [place] American male previously seen for cerebellar stroke for followup.  Interval history-patient denies any falls. Patient wife reports she's been going on walks almost daily. There is no unsteadiness. He occasionally has intermittent vertigo associated with bending his head forward. He denies any nausea or vomiting.  After starting Chantix, patient able to quit smoking approximately 2 months ago. Patient has been able to abstain from cigarette smoking.  Hypertension- Systolic blood pressure usually 130s at home.  Hyperlipidemia - good medication compliance.    Review of Systems Mild weight gain.  No depressive symptoms  Past Medical History  Diagnosis Date  . ED (erectile dysfunction)   . Depression   . Hyperlipidemia   . History of cerebrovascular accident   . Diabetes mellitus, type II   . Stroke     History   Social History  . Marital Status: Married    Spouse Name: N/A    Number of Children: N/A  . Years of Education: N/A   Occupational History  . Not on file.   Social History Main Topics  . Smoking status: Current Every Day Smoker -- 1.00 packs/day  . Smokeless tobacco: Not on file  . Alcohol Use: No  . Drug Use: Yes    Special: Marijuana     Comment: marijuana  . Sexually Active: Not on file   Other Topics Concern  . Not on file   Social History Narrative   Retired   Married   Current Smoker    Alcohol use-no         son Sean Myers living in [place] ([place]) -  engaged to be married    Past Surgical History  Procedure Laterality Date  . Colonoscopy  1990's    [facility]  . Tee without cardioversion N/A 01/01/2013    Procedure: TRANSESOPHAGEAL ECHOCARDIOGRAM (TEE);  Surgeon: Laurey Morale, MD;  Location: [facility] ENDOSCOPY;  Service: Cardiovascular;  Laterality: N/A;    Family History  Problem Relation Age of Onset  . Heart  disease Mother   . Other      no family history of colon cancer    Allergies  Allergen Reactions  . Poliovirus Vaccine Inactivated     Heart races, cold sweats, "almost killed me".  . Aspirin     Gi upset  . Penicillins Swelling, Palpitations and Rash    Current Outpatient Prescriptions on File Prior to Visit  Medication Sig Dispense Refill  . amLODipine (NORVASC) 10 MG tablet Take 1 tablet (10 mg total) by mouth daily.  90 tablet  1  . clopidogrel (PLAVIX) 75 MG tablet Take 1 tablet (75 mg total) by mouth daily.  90 tablet  1  . glucose blood (ACCU-CHEK AVIVA) test strip Use once a day to check blood sugar       . Lancets (ACCU-CHEK MULTICLIX) lancets Use once a day to check blood sugar       . ondansetron (ZOFRAN) 4 MG tablet Take 1 tablet (4 mg total) by mouth every 8 (eight) hours as needed for nausea.  30 tablet  0  . sertraline (ZOLOFT) 25 MG tablet Take 1 tablet (25 mg total) by mouth daily.  90 tablet  1  . sildenafil (VIAGRA) 100 MG tablet Take 100 mg by mouth daily as needed for erectile dysfunction.      . varenicline (CHANTIX CONTINUING  MONTH PAK) 1 MG tablet Take 1 tablet (1 mg total) by mouth 2 (two) times daily.  60 tablet  3  . varenicline (CHANTIX STARTING MONTH PAK) 0.5 MG X 11 & 1 MG X 42 tablet Take one 0.5 mg tablet by mouth once daily for 3 days, then increase to one 0.5 mg tablet twice daily for 4 days, then increase to one 1 mg tablet twice daily.  53 tablet  0   No current facility-administered medications on file prior to visit.    BP 140/90  Pulse 64  Temp(Src) 98.2 F (36.8 C) (Oral)  Wt 178 lb (80.74 kg)  BMI 24.84 kg/m2       Objective:   Physical Exam  Constitutional: He is oriented to person, place, and time. He appears well-developed and well-nourished.  HENT:  Head: Normocephalic and atraumatic.  Eyes: EOM are normal. Pupils are equal, round, and reactive to light.  Cardiovascular: Normal rate, regular rhythm and normal heart sounds.    No murmur heard. Pulmonary/Chest: Effort normal and breath sounds normal. He has no wheezes.  Musculoskeletal: He exhibits no edema.  Neurological: He is alert and oriented to person, place, and time. No cranial nerve deficit.  Ambulates with single prong cane  Skin: Skin is warm and dry.  Psychiatric: He has a normal mood and affect. His behavior is normal.          Assessment & Plan:

## 2013-05-07 ENCOUNTER — Other Ambulatory Visit (INDEPENDENT_AMBULATORY_CARE_PROVIDER_SITE_OTHER): Payer: Medicare Other

## 2013-05-07 DIAGNOSIS — E8809 Other disorders of plasma-protein metabolism, not elsewhere classified: Secondary | ICD-10-CM | POA: Diagnosis not present

## 2013-05-07 LAB — HEPATIC FUNCTION PANEL
ALT: 11 U/L (ref 0–53)
AST: 19 U/L (ref 0–37)
Alkaline Phosphatase: 67 U/L (ref 39–117)
Total Bilirubin: 0.5 mg/dL (ref 0.3–1.2)

## 2013-05-07 LAB — BASIC METABOLIC PANEL
CO2: 26 mEq/L (ref 19–32)
Calcium: 8.7 mg/dL (ref 8.4–10.5)
Creatinine, Ser: 1 mg/dL (ref 0.4–1.5)
Glucose, Bld: 126 mg/dL — ABNORMAL HIGH (ref 70–99)

## 2013-05-11 DIAGNOSIS — M79609 Pain in unspecified limb: Secondary | ICD-10-CM | POA: Diagnosis not present

## 2013-05-11 DIAGNOSIS — L03039 Cellulitis of unspecified toe: Secondary | ICD-10-CM | POA: Diagnosis not present

## 2013-05-11 DIAGNOSIS — B351 Tinea unguium: Secondary | ICD-10-CM | POA: Diagnosis not present

## 2013-05-11 DIAGNOSIS — M25579 Pain in unspecified ankle and joints of unspecified foot: Secondary | ICD-10-CM | POA: Diagnosis not present

## 2013-05-18 ENCOUNTER — Ambulatory Visit: Payer: Self-pay | Admitting: Internal Medicine

## 2013-05-18 DIAGNOSIS — T8189XA Other complications of procedures, not elsewhere classified, initial encounter: Secondary | ICD-10-CM | POA: Diagnosis not present

## 2013-05-19 ENCOUNTER — Ambulatory Visit (INDEPENDENT_AMBULATORY_CARE_PROVIDER_SITE_OTHER): Payer: Medicare Other | Admitting: *Deleted

## 2013-05-19 ENCOUNTER — Ambulatory Visit: Payer: BC Managed Care – PPO | Admitting: Internal Medicine

## 2013-05-19 DIAGNOSIS — Z23 Encounter for immunization: Secondary | ICD-10-CM

## 2013-05-25 DIAGNOSIS — B351 Tinea unguium: Secondary | ICD-10-CM | POA: Diagnosis not present

## 2013-07-02 ENCOUNTER — Ambulatory Visit (INDEPENDENT_AMBULATORY_CARE_PROVIDER_SITE_OTHER): Payer: Medicare Other | Admitting: Internal Medicine

## 2013-07-02 ENCOUNTER — Encounter: Payer: Self-pay | Admitting: Internal Medicine

## 2013-07-02 VITALS — BP 140/80 | HR 72 | Temp 98.1°F | Wt 191.0 lb

## 2013-07-02 DIAGNOSIS — Z8679 Personal history of other diseases of the circulatory system: Secondary | ICD-10-CM | POA: Diagnosis not present

## 2013-07-02 DIAGNOSIS — I10 Essential (primary) hypertension: Secondary | ICD-10-CM

## 2013-07-02 DIAGNOSIS — F329 Major depressive disorder, single episode, unspecified: Secondary | ICD-10-CM | POA: Diagnosis not present

## 2013-07-02 MED ORDER — SIMVASTATIN 20 MG PO TABS: 20.0000 mg | ORAL_TABLET | Freq: Every day | ORAL | Status: DC

## 2013-07-02 MED ORDER — LOSARTAN POTASSIUM 50 MG PO TABS: 50.0000 mg | ORAL_TABLET | Freq: Every day | ORAL | Status: DC

## 2013-07-02 MED ORDER — AMLODIPINE BESYLATE 10 MG PO TABS: 10.0000 mg | ORAL_TABLET | Freq: Every day | ORAL | Status: DC

## 2013-07-02 MED ORDER — SERTRALINE HCL 25 MG PO TABS: 25.0000 mg | ORAL_TABLET | Freq: Every day | ORAL | Status: DC

## 2013-07-02 MED ORDER — CLOPIDOGREL BISULFATE 75 MG PO TABS: 75.0000 mg | ORAL_TABLET | Freq: Every day | ORAL | Status: DC

## 2013-07-02 NOTE — Progress Notes (Signed)
Subjective:    Patient ID: Sean Myers, male    DOB: 1946/02/13, 67 y.o.   MRN: [id number]  HPI  67 year old African American male with history of multiple strokes, hyperlipidemia and depression for followup. Patient denies any significant interval medical history. He has been doing well. He is accompanied by supportive wife. He is taking his medications as directed. Patient has been able to sustain tobacco cessation. He has gained weight since previous visit. He does not exercise on a regular basis.  Review of Systems Negative for chest pain   Past Medical History  Diagnosis Date  . ED (erectile dysfunction)   . Depression   . Hyperlipidemia   . History of cerebrovascular accident   . Diabetes mellitus, type II   . Stroke     History   Social History  . Marital Status: Married    Spouse Name: N/A    Number of Children: N/A  . Years of Education: N/A   Occupational History  . Not on file.   Social History Main Topics  . Smoking status: Former Smoker -- 1.00 packs/day    Quit date: 02/15/2013  . Smokeless tobacco: Not on file  . Alcohol Use: No  . Drug Use: Yes    Special: Marijuana     Comment: marijuana  . Sexual Activity: Not on file   Other Topics Concern  . Not on file   Social History Narrative   Retired   Married   Current Smoker    Alcohol use-no         son Lavone Neri living in [place] ([place]) -  engaged to be married    Past Surgical History  Procedure Laterality Date  . Colonoscopy  1990's    [facility]  . Tee without cardioversion N/A 01/01/2013    Procedure: TRANSESOPHAGEAL ECHOCARDIOGRAM (TEE);  Surgeon: Laurey Morale, MD;  Location: [facility] ENDOSCOPY;  Service: Cardiovascular;  Laterality: N/A;    Family History  Problem Relation Age of Onset  . Heart disease Mother   . Other      no family history of colon cancer    Allergies  Allergen Reactions  . Poliovirus Vaccine Inactivated     Heart races, cold sweats, "almost killed  me".  . Aspirin     Gi upset  . Penicillins Swelling, Palpitations and Rash    Current Outpatient Prescriptions on File Prior to Visit  Medication Sig Dispense Refill  . glucose blood (ACCU-CHEK AVIVA) test strip Use once a day to check blood sugar       . Lancets (ACCU-CHEK MULTICLIX) lancets Use once a day to check blood sugar       . ondansetron (ZOFRAN) 4 MG tablet Take 1 tablet (4 mg total) by mouth every 8 (eight) hours as needed for nausea.  30 tablet  0  . sildenafil (VIAGRA) 100 MG tablet Take 100 mg by mouth daily as needed for erectile dysfunction.       No current facility-administered medications on file prior to visit.    BP 140/80  Pulse 72  Temp(Src) 98.1 F (36.7 C)  Wt 191 lb (86.637 kg)       Objective:   Physical Exam  Constitutional: He is oriented to person, place, and time. He appears well-developed and well-nourished.  HENT:  Head: Normocephalic and atraumatic.  Neck: Neck supple.  No carotid bruit  Cardiovascular: Normal rate, regular rhythm and normal heart sounds.   No murmur heard. Pulmonary/Chest: Effort normal and  breath sounds normal. He has no wheezes.  Musculoskeletal: He exhibits no edema.  Neurological: He is alert and oriented to person, place, and time. No cranial nerve deficit.  Ambulates without assistive devices  Skin: Skin is warm and dry.  Psychiatric: He has a normal mood and affect. His behavior is normal.          Assessment & Plan:

## 2013-07-02 NOTE — Assessment & Plan Note (Signed)
Continue aggressive risk factor management

## 2013-07-02 NOTE — Assessment & Plan Note (Signed)
Blood pressure Is suboptimally controlled. Increase losartan 50 mg. Patient will contact our office with home BP readings in one month.

## 2013-07-02 NOTE — Progress Notes (Signed)
Pre-visit discussion using our clinic review tool. No additional management support is needed unless otherwise documented below in the visit note.  

## 2013-07-02 NOTE — Patient Instructions (Signed)
Contact our office with your blood pressure readings in one month. Please complete the following lab tests before your next follow up appointment: BMET, A1c - 401.9, 790.29 FLP, LFTs, TSH - 272.4 PSA - V76.44 Needs 30 min slot for next office visit.

## 2013-07-02 NOTE — Assessment & Plan Note (Signed)
Stable on same dose of sertraline.

## 2013-09-24 ENCOUNTER — Telehealth: Payer: Self-pay | Admitting: Internal Medicine

## 2013-09-24 DIAGNOSIS — R413 Other amnesia: Secondary | ICD-10-CM

## 2013-09-24 NOTE — Telephone Encounter (Signed)
Pt having possible dementia or behavorial issues according to wife.  Recommend neuro psy eval. Per Dr Shawna Orleans

## 2013-09-27 NOTE — Addendum Note (Signed)
Addended by: Townsend Roger D on: 09/27/2013 01:57 PM   Modules accepted: Orders

## 2013-10-29 ENCOUNTER — Ambulatory Visit: Payer: Medicare Other | Admitting: Neurology

## 2013-11-01 ENCOUNTER — Encounter: Payer: Self-pay | Admitting: Neurology

## 2013-11-26 ENCOUNTER — Ambulatory Visit: Payer: Medicare Other | Admitting: Neurology

## 2013-12-23 ENCOUNTER — Other Ambulatory Visit: Payer: Medicare Other

## 2013-12-24 ENCOUNTER — Ambulatory Visit (INDEPENDENT_AMBULATORY_CARE_PROVIDER_SITE_OTHER): Payer: Medicare Other | Admitting: Neurology

## 2013-12-24 ENCOUNTER — Encounter: Payer: Self-pay | Admitting: Neurology

## 2013-12-24 ENCOUNTER — Other Ambulatory Visit (INDEPENDENT_AMBULATORY_CARE_PROVIDER_SITE_OTHER): Payer: Medicare Other

## 2013-12-24 VITALS — BP 122/70 | HR 74 | Temp 98.0°F | Resp 16 | Ht 71.0 in | Wt 195.3 lb

## 2013-12-24 DIAGNOSIS — R413 Other amnesia: Secondary | ICD-10-CM | POA: Diagnosis not present

## 2013-12-24 DIAGNOSIS — E039 Hypothyroidism, unspecified: Secondary | ICD-10-CM

## 2013-12-24 DIAGNOSIS — F0150 Vascular dementia without behavioral disturbance: Secondary | ICD-10-CM | POA: Insufficient documentation

## 2013-12-24 DIAGNOSIS — N138 Other obstructive and reflux uropathy: Secondary | ICD-10-CM

## 2013-12-24 DIAGNOSIS — R7309 Other abnormal glucose: Secondary | ICD-10-CM

## 2013-12-24 DIAGNOSIS — I10 Essential (primary) hypertension: Secondary | ICD-10-CM

## 2013-12-24 DIAGNOSIS — N401 Enlarged prostate with lower urinary tract symptoms: Secondary | ICD-10-CM

## 2013-12-24 DIAGNOSIS — E785 Hyperlipidemia, unspecified: Secondary | ICD-10-CM

## 2013-12-24 LAB — HEMOGLOBIN A1C: HEMOGLOBIN A1C: 6.3 % (ref 4.6–6.5)

## 2013-12-24 LAB — BASIC METABOLIC PANEL
BUN: 16 mg/dL (ref 6–23)
CALCIUM: 9.1 mg/dL (ref 8.4–10.5)
CO2: 25 mEq/L (ref 19–32)
CREATININE: 1 mg/dL (ref 0.4–1.5)
Chloride: 108 mEq/L (ref 96–112)
GFR: 98.8 mL/min (ref >60.00)
GLUCOSE: 97 mg/dL (ref 70–99)
Potassium: 4.2 mEq/L (ref 3.5–5.1)
Sodium: 141 mEq/L (ref 135–145)

## 2013-12-24 LAB — LIPID PANEL
CHOLESTEROL: 131 mg/dL (ref 0–200)
HDL: 34.3 mg/dL — AB (ref >39.00)
LDL CALC: 78 mg/dL (ref 0–99)
TRIGLYCERIDES: 92 mg/dL (ref 0.0–149.0)
Total CHOL/HDL Ratio: 4
VLDL: 18.4 mg/dL (ref 0.0–40.0)

## 2013-12-24 LAB — CBC WITH DIFFERENTIAL/PLATELET
BASOS ABS: 0 10*3/uL (ref 0.0–0.1)
Basophils Relative: 0.3 % (ref 0.0–3.0)
EOS PCT: 2.4 % (ref 0.0–5.0)
Eosinophils Absolute: 0.2 10*3/uL (ref 0.0–0.7)
HCT: 42.1 % (ref 39.0–52.0)
Hemoglobin: 13.8 g/dL (ref 13.0–17.0)
LYMPHS ABS: 2.5 10*3/uL (ref 0.7–4.0)
Lymphocytes Relative: 38.9 % (ref 12.0–46.0)
MCHC: 32.7 g/dL (ref 30.0–36.0)
MCV: 84.5 fl (ref 78.0–100.0)
MONO ABS: 0.6 10*3/uL (ref 0.1–1.0)
MONOS PCT: 8.6 % (ref 3.0–12.0)
NEUTROS PCT: 49.8 % (ref 43.0–77.0)
Neutro Abs: 3.2 10*3/uL (ref 1.4–7.7)
PLATELETS: 270 10*3/uL (ref 150.0–400.0)
RBC: 4.98 Mil/uL (ref 4.22–5.81)
RDW: 15 % — AB (ref 11.5–14.6)
WBC: 6.5 10*3/uL (ref 4.5–10.5)

## 2013-12-24 LAB — PSA: PSA: 4.73 ng/mL — ABNORMAL HIGH (ref 0.10–4.00)

## 2013-12-24 LAB — TSH: TSH: 0.74 u[IU]/mL (ref 0.35–5.50)

## 2013-12-24 LAB — HEPATIC FUNCTION PANEL
ALT: 18 U/L (ref 0–53)
AST: 20 U/L (ref 0–37)
Albumin: 3.6 g/dL (ref 3.5–5.2)
Alkaline Phosphatase: 69 U/L (ref 39–117)
BILIRUBIN DIRECT: 0.1 mg/dL (ref 0.0–0.3)
BILIRUBIN TOTAL: 0.5 mg/dL (ref 0.3–1.2)
Total Protein: 7 g/dL (ref 6.0–8.3)
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
## 2013-12-24 NOTE — Patient Instructions (Signed)
I think the cognitive problems are related to the stroke rather than Alzheimer's disease.  1.  Continue plavix, blood pressure control, blood sugar control and cholesterol control. 2.  Take regular walks outside with your wife. 3.  During the day, do not just sit around watching TV and napping.  Must stimulate your mind.  Read, play puzzles or brain teasers.  Be social and interact with people. 4.  Recommend not using the stove or coffee pot when alone in the house. 5.  No driving. 6.  We will check vitamin B12 level 7.  Follow up in 6 months.

## 2013-12-25 LAB — VITAMIN B12: Vitamin B-12: 419 pg/mL (ref 211–911)

## 2013-12-30 ENCOUNTER — Ambulatory Visit: Payer: Medicare Other | Admitting: Internal Medicine

## 2014-01-14 ENCOUNTER — Encounter: Payer: Self-pay | Admitting: Internal Medicine

## 2014-01-14 ENCOUNTER — Ambulatory Visit (INDEPENDENT_AMBULATORY_CARE_PROVIDER_SITE_OTHER): Payer: Medicare Other | Admitting: Internal Medicine

## 2014-01-14 VITALS — BP 122/76 | HR 68 | Temp 97.9°F | Ht 71.0 in | Wt 196.0 lb

## 2014-01-14 DIAGNOSIS — R972 Elevated prostate specific antigen [PSA]: Secondary | ICD-10-CM

## 2014-01-14 DIAGNOSIS — F329 Major depressive disorder, single episode, unspecified: Secondary | ICD-10-CM | POA: Diagnosis not present

## 2014-01-14 DIAGNOSIS — R7309 Other abnormal glucose: Secondary | ICD-10-CM

## 2014-01-14 DIAGNOSIS — F3289 Other specified depressive episodes: Secondary | ICD-10-CM

## 2014-01-14 DIAGNOSIS — N400 Enlarged prostate without lower urinary tract symptoms: Secondary | ICD-10-CM

## 2014-01-14 MED ORDER — AMLODIPINE BESYLATE 10 MG PO TABS: 10.0000 mg | ORAL_TABLET | Freq: Every day | ORAL | Status: DC

## 2014-01-14 MED ORDER — TAMSULOSIN HCL 0.4 MG PO CAPS
0.4000 mg | ORAL_CAPSULE | Freq: Every day | ORAL | Status: DC
Start: 2014-01-14 — End: 2014-03-21

## 2014-01-14 MED ORDER — SERTRALINE HCL 25 MG PO TABS: 25.0000 mg | ORAL_TABLET | Freq: Every day | ORAL | Status: DC

## 2014-01-14 MED ORDER — CLOPIDOGREL BISULFATE 75 MG PO TABS: 75.0000 mg | ORAL_TABLET | Freq: Every day | ORAL | Status: DC

## 2014-01-14 MED ORDER — SIMVASTATIN 20 MG PO TABS
20.0000 mg | ORAL_TABLET | Freq: Every day | ORAL | Status: DC
Start: 2014-01-14 — End: 2014-06-22

## 2014-01-14 MED ORDER — FINASTERIDE 5 MG PO TABS: 5.0000 mg | ORAL_TABLET | Freq: Every day | ORAL | Status: DC

## 2014-01-14 MED ORDER — LOSARTAN POTASSIUM 50 MG PO TABS: 50.0000 mg | ORAL_TABLET | Freq: Every day | ORAL | Status: DC

## 2014-01-14 NOTE — Assessment & Plan Note (Signed)
Patient having progressively symptomatic BPH. Start tamsulosin 0.4 mg at bedtime and finasteride 5 mg once daily.

## 2014-01-14 NOTE — Assessment & Plan Note (Signed)
Stable.  Continue same dose of sertraline.

## 2014-01-14 NOTE — Assessment & Plan Note (Addendum)
Stable. Continue dietary management.  Patient advised to avoid sweets.

## 2014-01-14 NOTE — Progress Notes (Signed)
Subjective:    Patient ID: Sean Myers, male    DOB: 08-Nov-1945, 68 y.o.   MRN: [id number]  HPI  68 year old African American male with history of stroke, borderline type 2 diabetes and depression for routine followup. Interval medical history patient was seen by neurologist for issues with short-term memory. Patient's memory problems thought to be secondary to vascular dementia. His wife reports his overall functioning is normal. The family has difficulty motivating patient to socialize more indication physical activity.  He has history of BPH. He reports nocturia x4. He has weak urine stream. His recent PSA was elevated.   Lab Results  Component Value Date   PSA 4.73* 12/24/2013   PSA 5.16* 03/21/2011   PSA 2.20 11/17/2007   He denies family history of prostate cancer.  Borderline DM II - stable.  Lab Results  Component Value Date   HGBA1C 6.3 12/24/2013     Review of Systems Negative for weight gain,  Negative for depressive symptoms.  He has not resumed smoking.    Past Medical History  Diagnosis Date  . ED (erectile dysfunction)   . Depression   . Hyperlipidemia   . History of cerebrovascular accident   . Diabetes mellitus, type II   . Stroke     History   Social History  . Marital Status: Married    Spouse Name: N/A    Number of Children: N/A  . Years of Education: N/A   Occupational History  . Not on file.   Social History Main Topics  . Smoking status: Former Smoker -- 1.00 packs/day    Quit date: 02/15/2013  . Smokeless tobacco: Not on file  . Alcohol Use: No  . Drug Use: Yes    Special: Marijuana     Comment: marijuana  . Sexual Activity: Yes    Partners: Female   Other Topics Concern  . Not on file   Social History Narrative   Retired   Married   Current Smoker    Alcohol use-no         son [name] living in [place] ([place]) -  engaged to be married    Past Surgical History  Procedure Laterality Date  . Colonoscopy  1990's   [facility]  . Tee without cardioversion N/A 01/01/2013    Procedure: TRANSESOPHAGEAL ECHOCARDIOGRAM (TEE);  Surgeon: Larey Dresser, MD;  Location: [facility] ENDOSCOPY;  Service: Cardiovascular;  Laterality: N/A;    Family History  Problem Relation Age of Onset  . Heart disease Mother   . Other      no family history of colon cancer    Allergies  Allergen Reactions  . Poliovirus Vaccine Inactivated     Heart races, cold sweats, "almost killed me".  . Aspirin     Gi upset  . Penicillins Swelling, Palpitations and Rash    No current outpatient prescriptions on file prior to visit.   No current facility-administered medications on file prior to visit.    BP 122/76  Pulse 68  Temp(Src) 97.9 F (36.6 C) (Oral)  Ht 5\' 11"  (1.803 m)  Wt 196 lb (88.905 kg)  BMI 27.35 kg/m2    Objective:   Physical Exam  Constitutional: He appears well-developed and well-nourished. No distress.  HENT:  Head: Normocephalic and atraumatic.  Right Ear: External ear normal.  Left Ear: External ear normal.  Cardiovascular: Normal rate, regular rhythm and normal heart sounds.   No murmur heard. Pulmonary/Chest: Effort normal and breath sounds normal.  He has no wheezes.  Genitourinary: Rectum normal. Guaiac negative stool.  Unable to palpate prostate gland  Musculoskeletal: He exhibits no edema.  Neurological: He is alert.  Skin: Skin is warm and dry.          Assessment & Plan:

## 2014-01-14 NOTE — Progress Notes (Signed)
Pre visit review using our clinic review tool, if applicable. No additional management support is needed unless otherwise documented below in the visit note. 

## 2014-01-14 NOTE — Assessment & Plan Note (Signed)
Patient last seen by urologists in 2013. His transiently elevated PSA thought to be secondary to BPH. I am unable to perform adequate prostate exam.  Consult urology for re evaulation.  Lab Results  Component Value Date   PSA 4.73* 12/24/2013   PSA 5.16* 03/21/2011   PSA 2.20 11/17/2007

## 2014-03-21 ENCOUNTER — Encounter: Payer: Self-pay | Admitting: Internal Medicine

## 2014-03-21 ENCOUNTER — Ambulatory Visit (INDEPENDENT_AMBULATORY_CARE_PROVIDER_SITE_OTHER): Payer: Medicare Other | Admitting: Internal Medicine

## 2014-03-21 VITALS — BP 124/78 | HR 64 | Temp 98.1°F | Ht 71.0 in | Wt 190.0 lb

## 2014-03-21 DIAGNOSIS — R972 Elevated prostate specific antigen [PSA]: Secondary | ICD-10-CM | POA: Diagnosis not present

## 2014-03-21 DIAGNOSIS — N400 Enlarged prostate without lower urinary tract symptoms: Secondary | ICD-10-CM

## 2014-03-21 MED ORDER — FINASTERIDE 5 MG PO TABS: 5.0000 mg | ORAL_TABLET | Freq: Every day | ORAL | Status: DC

## 2014-03-21 MED ORDER — TAMSULOSIN HCL 0.4 MG PO CAPS: 0.4000 mg | ORAL_CAPSULE | Freq: Every day | ORAL | Status: DC

## 2014-03-21 NOTE — Assessment & Plan Note (Signed)
Patient to call urologist office to confirm appointment.

## 2014-03-21 NOTE — Progress Notes (Signed)
Subjective:    Patient ID: Sean Myers, male    DOB: September 20, 1945, 68 y.o.   MRN: [id number]  HPI  68 year old [place] American male with history of hypertension and CVA for routine followup. Patient previously seen for elevated PSA and BPH symptoms. Patient and wife not sure whether he was seen by urologists. He was also prescribed tamsulosin and finasteride. They have not started medication.  Patient has persistent BPH symptoms.  Review of Systems Negative for chest pain    Past Medical History  Diagnosis Date  . ED (erectile dysfunction)   . Depression   . Hyperlipidemia   . History of cerebrovascular accident   . Diabetes mellitus, type II   . Stroke     History   Social History  . Marital Status: Married    Spouse Name: N/A    Number of Children: N/A  . Years of Education: N/A   Occupational History  . Not on file.   Social History Main Topics  . Smoking status: Former Smoker -- 1.00 packs/day    Quit date: 02/15/2013  . Smokeless tobacco: Not on file  . Alcohol Use: No  . Drug Use: Yes    Special: Marijuana     Comment: marijuana  . Sexual Activity: Yes    Partners: Female   Other Topics Concern  . Not on file   Social History Narrative   Retired   Married   Current Smoker    Alcohol use-no         son [name] living in [place] ([place]) -  engaged to be married    Past Surgical History  Procedure Laterality Date  . Colonoscopy  1990's    [facility]  . Tee without cardioversion N/A 01/01/2013    Procedure: TRANSESOPHAGEAL ECHOCARDIOGRAM (TEE);  Surgeon: Larey Dresser, MD;  Location: [facility] ENDOSCOPY;  Service: Cardiovascular;  Laterality: N/A;    Family History  Problem Relation Age of Onset  . Heart disease Mother   . Other      no family history of colon cancer    Allergies  Allergen Reactions  . Poliovirus Vaccine Inactivated     Heart races, cold sweats, "almost killed me".  . Aspirin     Gi upset  . Penicillins Swelling,  Palpitations and Rash    Current Outpatient Prescriptions on File Prior to Visit  Medication Sig Dispense Refill  . amLODipine (NORVASC) 10 MG tablet Take 1 tablet (10 mg total) by mouth daily.  90 tablet  1  . clopidogrel (PLAVIX) 75 MG tablet Take 1 tablet (75 mg total) by mouth daily.  90 tablet  1  . losartan (COZAAR) 50 MG tablet Take 1 tablet (50 mg total) by mouth daily.  90 tablet  1  . sertraline (ZOLOFT) 25 MG tablet Take 1 tablet (25 mg total) by mouth daily.  90 tablet  1  . simvastatin (ZOCOR) 20 MG tablet Take 1 tablet (20 mg total) by mouth daily at 6 PM.  90 tablet  1   No current facility-administered medications on file prior to visit.    BP 124/78  Pulse 64  Temp(Src) 98.1 F (36.7 C) (Oral)  Ht 5\' 11"  (1.803 m)  Wt 190 lb (86.183 kg)  BMI 26.51 kg/m2    Objective:   Physical Exam  Constitutional: He is oriented to person, place, and time. He appears well-developed and well-nourished.  Cardiovascular: Normal rate, regular rhythm and normal heart sounds.   No murmur  heard. Pulmonary/Chest: Effort normal and breath sounds normal. He has no wheezes.  Musculoskeletal: He exhibits no edema.  Neurological: He is alert and oriented to person, place, and time. No cranial nerve deficit.  Psychiatric: He has a normal mood and affect. His behavior is normal.          Assessment & Plan:

## 2014-03-21 NOTE — Patient Instructions (Addendum)
Please complete the following lab tests before your next follow up appointment: BMET, A1c - 790.29 PSA - 790.93

## 2014-03-21 NOTE — Assessment & Plan Note (Signed)
Patient never started tamsulosin or finasteride.  New Rx sent to pharmacy.  We discussed common side effects.

## 2014-03-21 NOTE — Progress Notes (Signed)
Pre visit review using our clinic review tool, if applicable. No additional management support is needed unless otherwise documented below in the visit note. 

## 2014-04-11 DIAGNOSIS — R39198 Other difficulties with micturition: Secondary | ICD-10-CM | POA: Diagnosis not present

## 2014-04-11 DIAGNOSIS — R972 Elevated prostate specific antigen [PSA]: Secondary | ICD-10-CM | POA: Diagnosis not present

## 2014-06-22 ENCOUNTER — Encounter: Payer: Self-pay | Admitting: Internal Medicine

## 2014-06-22 ENCOUNTER — Ambulatory Visit (INDEPENDENT_AMBULATORY_CARE_PROVIDER_SITE_OTHER): Payer: Medicare Other | Admitting: Internal Medicine

## 2014-06-22 VITALS — BP 130/80 | HR 60 | Temp 98.0°F | Ht 71.0 in | Wt 193.0 lb

## 2014-06-22 DIAGNOSIS — Z23 Encounter for immunization: Secondary | ICD-10-CM | POA: Diagnosis not present

## 2014-06-22 DIAGNOSIS — N400 Enlarged prostate without lower urinary tract symptoms: Secondary | ICD-10-CM | POA: Diagnosis not present

## 2014-06-22 DIAGNOSIS — R972 Elevated prostate specific antigen [PSA]: Secondary | ICD-10-CM | POA: Diagnosis not present

## 2014-06-22 DIAGNOSIS — Z7189 Other specified counseling: Secondary | ICD-10-CM

## 2014-06-22 DIAGNOSIS — I10 Essential (primary) hypertension: Secondary | ICD-10-CM

## 2014-06-22 MED ORDER — SIMVASTATIN 20 MG PO TABS: 20.0000 mg | ORAL_TABLET | Freq: Every day | ORAL | Status: DC

## 2014-06-22 MED ORDER — FINASTERIDE 5 MG PO TABS: 5.0000 mg | ORAL_TABLET | Freq: Every day | ORAL | Status: DC

## 2014-06-22 MED ORDER — TAMSULOSIN HCL 0.4 MG PO CAPS: 0.4000 mg | ORAL_CAPSULE | Freq: Every day | ORAL | Status: DC

## 2014-06-22 MED ORDER — CLOPIDOGREL BISULFATE 75 MG PO TABS: 75.0000 mg | ORAL_TABLET | Freq: Every day | ORAL | Status: DC

## 2014-06-22 MED ORDER — LOSARTAN POTASSIUM 50 MG PO TABS: 50.0000 mg | ORAL_TABLET | Freq: Every day | ORAL | Status: DC

## 2014-06-22 MED ORDER — AMLODIPINE BESYLATE 10 MG PO TABS: 10.0000 mg | ORAL_TABLET | Freq: Every day | ORAL | Status: DC

## 2014-06-22 MED ORDER — SERTRALINE HCL 25 MG PO TABS: 25.0000 mg | ORAL_TABLET | Freq: Every day | ORAL | Status: DC

## 2014-06-22 NOTE — Assessment & Plan Note (Signed)
Well-controlled. No change in medication regimen. BP: 130/80 mmHg

## 2014-06-22 NOTE — Assessment & Plan Note (Signed)
Patient and wife encouraged to discuss advance directives with family members.

## 2014-06-22 NOTE — Assessment & Plan Note (Signed)
He was evaluated by urologist. Follow-up yearly for repeat PSA and prostate exam

## 2014-06-22 NOTE — Patient Instructions (Signed)
Schedule next office visit as medicare wellness exam

## 2014-06-22 NOTE — Assessment & Plan Note (Signed)
Good response to tamsulosin and finasteride. Continue same medication.

## 2014-06-22 NOTE — Progress Notes (Signed)
   Subjective:    Patient ID: Sean Myers, male    DOB: Aug 12, 1946, 68 y.o.   MRN: [id number]  HPI  68 year old African American male with history of stroke, hypertension and hyperlipidemia for routine follow-up.  Interval medical history-patient's evaluated by urologist for abnormal PSA. Patient's prostate exam was benign. They plan to monitor patient for now.  Hypertension-stable  Hyperlipidemia-good compliance with medication.  History of recurrent stroke-it has been over one year since he discontinued smoking.    Social history-they are expecting a grandchild in January.  Review of Systems Negative for chest pain or shortness of breath    Past Medical History  Diagnosis Date  . ED (erectile dysfunction)   . Depression   . Hyperlipidemia   . History of cerebrovascular accident   . Diabetes mellitus, type II   . Stroke     History   Social History  . Marital Status: Married    Spouse Name: N/A    Number of Children: N/A  . Years of Education: N/A   Occupational History  . Not on file.   Social History Main Topics  . Smoking status: Former Smoker -- 1.00 packs/day    Quit date: 02/15/2013  . Smokeless tobacco: Not on file  . Alcohol Use: No  . Drug Use: Yes    Special: Marijuana     Comment: marijuana  . Sexual Activity: Yes    Partners: Female   Other Topics Concern  . Not on file   Social History Narrative   Retired   Married   Current Smoker    Alcohol use-no         son [name] living in [place] ([place]) -  engaged to be married    Past Surgical History  Procedure Laterality Date  . Colonoscopy  1990's    [facility]  . Tee without cardioversion N/A 01/01/2013    Procedure: TRANSESOPHAGEAL ECHOCARDIOGRAM (TEE);  Surgeon: Larey Dresser, MD;  Location: [facility] ENDOSCOPY;  Service: Cardiovascular;  Laterality: N/A;    Family History  Problem Relation Age of Onset  . Heart disease Mother   . Other      no family history of colon cancer     Allergies  Allergen Reactions  . Poliovirus Vaccine Inactivated     Heart races, cold sweats, "almost killed me".  . Aspirin     Gi upset  . Penicillins Swelling, Palpitations and Rash    No current outpatient prescriptions on file prior to visit.   No current facility-administered medications on file prior to visit.    BP 130/80  Pulse 60  Temp(Src) 98 F (36.7 C) (Oral)  Ht 5\' 11"  (1.803 m)  Wt 193 lb (87.544 kg)  BMI 26.93 kg/m2    Objective:   Physical Exam  Constitutional: He is oriented to person, place, and time. He appears well-developed and well-nourished. No distress.  Cardiovascular: Normal rate, regular rhythm and normal heart sounds.   No murmur heard. Pulmonary/Chest: Effort normal and breath sounds normal. He has no wheezes.  Musculoskeletal: He exhibits no edema.  Neurological: He is alert and oriented to person, place, and time. No cranial nerve deficit.  Psychiatric: He has a normal mood and affect. His behavior is normal.          Assessment & Plan:

## 2014-06-22 NOTE — Progress Notes (Signed)
Pre visit review using our clinic review tool, if applicable. No additional management support is needed unless otherwise documented below in the visit note. 

## 2014-06-24 ENCOUNTER — Other Ambulatory Visit: Payer: Self-pay | Admitting: Internal Medicine

## 2014-06-24 DIAGNOSIS — F32A Depression, unspecified: Secondary | ICD-10-CM

## 2014-06-24 DIAGNOSIS — F329 Major depressive disorder, single episode, unspecified: Secondary | ICD-10-CM

## 2014-06-24 MED ORDER — VENLAFAXINE HCL ER 37.5 MG PO CP24: ORAL_CAPSULE | ORAL | Status: DC

## 2014-06-24 NOTE — Assessment & Plan Note (Signed)
Patient experiencing depressive symptoms despite using sertraline.  Switch to generic Effexor XR.  Reassess in 4 weeks.

## 2014-07-01 ENCOUNTER — Ambulatory Visit: Payer: Medicare Other | Admitting: Neurology

## 2014-07-04 ENCOUNTER — Telehealth: Payer: Self-pay | Admitting: Neurology

## 2014-07-04 ENCOUNTER — Encounter: Payer: Self-pay | Admitting: *Deleted

## 2014-07-04 NOTE — Progress Notes (Signed)
No show letter sent for 07/01/2014

## 2014-07-04 NOTE — Telephone Encounter (Signed)
Pt no showed 07/01/14 appt w/ Dr. [name].   Danae Chen - please send no show letter / Gayleen Orem.

## 2014-12-23 ENCOUNTER — Ambulatory Visit: Payer: Medicare Other | Admitting: Internal Medicine

## 2015-02-09 ENCOUNTER — Other Ambulatory Visit: Payer: Self-pay | Admitting: Internal Medicine

## 2015-04-14 DIAGNOSIS — R3912 Poor urinary stream: Secondary | ICD-10-CM | POA: Diagnosis not present

## 2015-04-14 DIAGNOSIS — R972 Elevated prostate specific antigen [PSA]: Secondary | ICD-10-CM | POA: Diagnosis not present

## 2015-04-27 ENCOUNTER — Other Ambulatory Visit: Payer: Self-pay | Admitting: Internal Medicine

## 2015-05-05 ENCOUNTER — Ambulatory Visit (INDEPENDENT_AMBULATORY_CARE_PROVIDER_SITE_OTHER): Payer: Medicare Other | Admitting: Internal Medicine

## 2015-05-05 ENCOUNTER — Other Ambulatory Visit: Payer: Self-pay | Admitting: *Deleted

## 2015-05-05 ENCOUNTER — Encounter: Payer: Self-pay | Admitting: Internal Medicine

## 2015-05-05 VITALS — BP 120/76 | HR 63 | Temp 98.7°F | Resp 20 | Ht 71.0 in | Wt 202.0 lb

## 2015-05-05 DIAGNOSIS — F0150 Vascular dementia without behavioral disturbance: Secondary | ICD-10-CM | POA: Diagnosis not present

## 2015-05-05 DIAGNOSIS — I10 Essential (primary) hypertension: Secondary | ICD-10-CM | POA: Diagnosis not present

## 2015-05-05 DIAGNOSIS — Z8679 Personal history of other diseases of the circulatory system: Secondary | ICD-10-CM

## 2015-05-05 DIAGNOSIS — E119 Type 2 diabetes mellitus without complications: Secondary | ICD-10-CM

## 2015-05-05 DIAGNOSIS — Z23 Encounter for immunization: Secondary | ICD-10-CM

## 2015-05-05 LAB — LIPID PANEL
Cholesterol: 124 mg/dL (ref 0–200)
HDL: 40.9 mg/dL (ref >39.00)
LDL Cholesterol: 67 mg/dL (ref 0–99)
NonHDL: 82.95
Total CHOL/HDL Ratio: 3
Triglycerides: 82 mg/dL (ref 0.0–149.0)
VLDL: 16.4 mg/dL (ref 0.0–40.0)

## 2015-05-05 LAB — CBC WITH DIFFERENTIAL/PLATELET
BASOS ABS: 0 10*3/uL (ref 0.0–0.1)
Basophils Relative: 0.4 % (ref 0.0–3.0)
Eosinophils Absolute: 0.1 10*3/uL (ref 0.0–0.7)
Eosinophils Relative: 1.4 % (ref 0.0–5.0)
HCT: 44.8 % (ref 39.0–52.0)
HEMOGLOBIN: 14.5 g/dL (ref 13.0–17.0)
LYMPHS PCT: 36.3 % (ref 12.0–46.0)
Lymphs Abs: 2.8 10*3/uL (ref 0.7–4.0)
MCHC: 32.3 g/dL (ref 30.0–36.0)
MCV: 83.1 fl (ref 78.0–100.0)
Monocytes Absolute: 0.6 10*3/uL (ref 0.1–1.0)
Monocytes Relative: 8.5 % (ref 3.0–12.0)
NEUTROS PCT: 53.4 % (ref 43.0–77.0)
Neutro Abs: 4.1 10*3/uL (ref 1.4–7.7)
Platelets: 252 10*3/uL (ref 150.0–400.0)
RBC: 5.39 Mil/uL (ref 4.22–5.81)
RDW: 15.5 % (ref 11.5–15.5)
WBC: 7.6 10*3/uL (ref 4.0–10.5)

## 2015-05-05 LAB — COMPREHENSIVE METABOLIC PANEL
ALK PHOS: 71 U/L (ref 39–117)
ALT: 13 U/L (ref 0–53)
AST: 15 U/L (ref 0–37)
Albumin: 3.8 g/dL (ref 3.5–5.2)
BUN: 12 mg/dL (ref 6–23)
CO2: 27 mEq/L (ref 19–32)
Calcium: 9.1 mg/dL (ref 8.4–10.5)
Chloride: 106 mEq/L (ref 96–112)
Creatinine, Ser: 1.07 mg/dL (ref 0.40–1.50)
GFR: 87.88 mL/min (ref >60.00)
Glucose, Bld: 89 mg/dL (ref 70–99)
POTASSIUM: 3.8 meq/L (ref 3.5–5.1)
Sodium: 141 mEq/L (ref 135–145)
TOTAL PROTEIN: 7.3 g/dL (ref 6.0–8.3)
Total Bilirubin: 0.5 mg/dL (ref 0.2–1.2)

## 2015-05-05 LAB — TSH: TSH: 1.08 u[IU]/mL (ref 0.35–4.50)

## 2015-05-05 LAB — HEMOGLOBIN A1C: Hgb A1c MFr Bld: 6.5 % (ref 4.6–6.5)

## 2015-05-05 MED ORDER — VENLAFAXINE HCL ER 37.5 MG PO CP24: 37.5000 mg | ORAL_CAPSULE | Freq: Every day | ORAL | Status: DC

## 2015-05-05 MED ORDER — TAMSULOSIN HCL 0.4 MG PO CAPS: 0.4000 mg | ORAL_CAPSULE | Freq: Every day | ORAL | Status: DC

## 2015-05-05 NOTE — Addendum Note (Signed)
Addended by: Marian Sorrow on: 05/05/2015 01:45 PM   Modules accepted: Orders

## 2015-05-05 NOTE — Patient Instructions (Signed)
Limit your sodium (Salt) intake  Please check your blood pressure on a regular basis.  If it is consistently greater than 150/90, please make an office appointment.    It is important that you exercise regularly, at least 20 minutes 3 to 4 times per week.  If you develop chest pain or shortness of breath seek  medical attention.  Return in 6 months for follow-up  

## 2015-05-05 NOTE — Progress Notes (Signed)
Subjective:    Patient ID: Sean Myers, male    DOB: 1946/01/04, 69 y.o.   MRN: [id number]  HPI  BP Readings from Last 3 Encounters:  05/05/15 120/76  06/22/14 130/80  03/21/14 47/66    69 year old patient who is seen today for follow-up.  He has a history of dyslipidemia and essential hypertension.  He has been seen by neurology for vascular dementia and confusion.  He has a history of impaired glucose tolerance and also a history depression.  He has been on Effexor 37.5 milligrams once daily.  Remains on statin therapy.  His hypertension has been well controlled.  Remains on Plavix for secondary stroke prevention.  Denies any focal neurological symptoms.  No recent lab  Past Medical History  Diagnosis Date  . ED (erectile dysfunction)   . Depression   . Hyperlipidemia   . History of cerebrovascular accident   . Diabetes mellitus, type II   . Stroke     Social History   Social History  . Marital Status: Married    Spouse Name: N/A  . Number of Children: N/A  . Years of Education: N/A   Occupational History  . Not on file.   Social History Main Topics  . Smoking status: Former Smoker -- 1.00 packs/day    Quit date: 02/15/2013  . Smokeless tobacco: Not on file  . Alcohol Use: No  . Drug Use: Yes    Special: Marijuana     Comment: marijuana  . Sexual Activity:    Partners: Female   Other Topics Concern  . Not on file   Social History Narrative   Retired   Married   Current Smoker    Alcohol use-no         son [name] living in [place] ([place]) -  engaged to be married    Past Surgical History  Procedure Laterality Date  . Colonoscopy  1990's    [facility]  . Tee without cardioversion N/A 01/01/2013    Procedure: TRANSESOPHAGEAL ECHOCARDIOGRAM (TEE);  Surgeon: Larey Dresser, MD;  Location: [facility] ENDOSCOPY;  Service: Cardiovascular;  Laterality: N/A;    Family History  Problem Relation Age of Onset  . Heart disease Mother   . Other      no  family history of colon cancer    Allergies  Allergen Reactions  . Poliovirus Vaccine Inactivated     Heart races, cold sweats, "almost killed me".  . Aspirin     Gi upset  . Penicillins Swelling, Palpitations and Rash    Current Outpatient Prescriptions on File Prior to Visit  Medication Sig Dispense Refill  . amLODipine (NORVASC) 10 MG tablet Take 1 tablet (10 mg total) by mouth daily. 90 tablet 1  . clopidogrel (PLAVIX) 75 MG tablet take 1 tablet by mouth once daily 90 tablet 0  . finasteride (PROSCAR) 5 MG tablet Take 1 tablet (5 mg total) by mouth daily. 90 tablet 1  . losartan (COZAAR) 50 MG tablet take 1 tablet by mouth once daily 90 tablet 0  . simvastatin (ZOCOR) 20 MG tablet Take 1 tablet (20 mg total) by mouth daily at 6 PM. 90 tablet 1  . tamsulosin (FLOMAX) 0.4 MG CAPS capsule Take 1 capsule (0.4 mg total) by mouth daily after supper. 90 capsule 1  . venlafaxine XR (EFFEXOR-XR) 37.5 MG 24 hr capsule Take one capsule daily in the AM for 2 weeks, then take two capsules in the AM once daily 60 capsule  2   No current facility-administered medications on file prior to visit.    BP 120/76 mmHg  Pulse 63  Temp(Src) 98.7 F (37.1 C) (Oral)  Resp 20  Ht 5\' 11"  (1.803 m)  Wt 202 lb (91.627 kg)  BMI 28.19 kg/m2  SpO2 98%       Review of Systems  Constitutional: Negative for fever, chills, appetite change and fatigue.  HENT: Negative for congestion, dental problem, ear pain, hearing loss, sore throat, tinnitus, trouble swallowing and voice change.   Eyes: Negative for pain, discharge and visual disturbance.  Respiratory: Negative for cough, chest tightness, wheezing and stridor.   Cardiovascular: Negative for chest pain, palpitations and leg swelling.  Gastrointestinal: Negative for nausea, vomiting, abdominal pain, diarrhea, constipation, blood in stool and abdominal distention.  Genitourinary: Negative for urgency, hematuria, flank pain, discharge, difficulty  urinating and genital sores.  Musculoskeletal: Negative for myalgias, back pain, joint swelling, arthralgias, gait problem and neck stiffness.  Skin: Negative for rash.  Neurological: Negative for dizziness, syncope, speech difficulty, weakness, numbness and headaches.  Hematological: Negative for adenopathy. Does not bruise/bleed easily.  Psychiatric/Behavioral: Positive for confusion. Negative for behavioral problems and dysphoric mood. The patient is not nervous/anxious.        Objective:   Physical Exam  Constitutional: He is oriented to person, place, and time. He appears well-developed.  Blood pressure well controlled  HENT:  Head: Normocephalic.  Right Ear: External ear normal.  Left Ear: External ear normal.  Eyes: Conjunctivae and EOM are normal.  Neck: Normal range of motion.  Cardiovascular: Normal rate and normal heart sounds.   Pulmonary/Chest: Breath sounds normal.  Abdominal: Bowel sounds are normal.  Musculoskeletal: Normal range of motion. He exhibits no edema or tenderness.  Neurological: He is alert and oriented to person, place, and time.  Psychiatric: He has a normal mood and affect. His behavior is normal.          Assessment & Plan:  Essential hypertension.  Will continue present regimen Dyslipidemia.  Will check lipid profile Depression.  Stable.  Will continue Effexor History of prior stroke.  Continue Plavix for secondary prevention Health maintenance.  Prevnar administered.  Recheck 6 months

## 2015-05-05 NOTE — Progress Notes (Signed)
Pre visit review using our clinic review tool, if applicable. No additional management support is needed unless otherwise documented below in the visit note. 

## 2015-05-08 ENCOUNTER — Other Ambulatory Visit: Payer: Self-pay | Admitting: Internal Medicine

## 2015-05-17 ENCOUNTER — Other Ambulatory Visit: Payer: Self-pay | Admitting: Internal Medicine

## 2015-05-19 ENCOUNTER — Other Ambulatory Visit: Payer: Self-pay | Admitting: Internal Medicine

## 2015-05-24 ENCOUNTER — Other Ambulatory Visit: Payer: Self-pay | Admitting: Internal Medicine

## 2015-08-23 ENCOUNTER — Encounter: Payer: Self-pay | Admitting: *Deleted

## 2016-02-06 ENCOUNTER — Other Ambulatory Visit: Payer: Self-pay | Admitting: *Deleted

## 2016-02-06 MED ORDER — TAMSULOSIN HCL 0.4 MG PO CAPS: 0.4000 mg | ORAL_CAPSULE | Freq: Every day | ORAL | Status: DC

## 2016-02-06 MED ORDER — AMLODIPINE BESYLATE 10 MG PO TABS: 10.0000 mg | ORAL_TABLET | Freq: Every day | ORAL | Status: DC

## 2016-02-06 MED ORDER — VENLAFAXINE HCL ER 37.5 MG PO CP24: 37.5000 mg | ORAL_CAPSULE | Freq: Every day | ORAL | Status: DC

## 2016-03-11 ENCOUNTER — Other Ambulatory Visit: Payer: Self-pay

## 2016-03-11 ENCOUNTER — Telehealth: Payer: Self-pay

## 2016-03-11 MED ORDER — FINASTERIDE 5 MG PO TABS: 5.0000 mg | ORAL_TABLET | Freq: Every day | ORAL | Status: DC

## 2016-03-11 MED ORDER — SIMVASTATIN 20 MG PO TABS: ORAL_TABLET | ORAL | Status: DC

## 2016-03-11 NOTE — Telephone Encounter (Signed)
Spoke with patient and informed him that he has not been seen since September. Stated he must schedule an appointment ASAP to be seen prior to next medication refills. Patient states he will get his wife to call back and schedule an appointment.

## 2016-04-01 ENCOUNTER — Other Ambulatory Visit: Payer: Self-pay | Admitting: Internal Medicine

## 2016-04-02 NOTE — Telephone Encounter (Signed)
Called patient and left a voicemail message as patient needs to be seen and establish care with a new provider to refill prescription. Awaiting return phone call.

## 2016-04-04 ENCOUNTER — Telehealth: Payer: Self-pay | Admitting: *Deleted

## 2016-04-04 NOTE — Telephone Encounter (Signed)
Spoke with patient and explained Dr. Shawna Orleans is no longer practicing here so he needs to re establish with another doctor. Also the medication that was requested is not on his list of meds. Patient stated he would have his wife call back and schedule appointment for him.

## 2016-04-25 ENCOUNTER — Ambulatory Visit (INDEPENDENT_AMBULATORY_CARE_PROVIDER_SITE_OTHER): Payer: Medicare Other | Admitting: Adult Health

## 2016-04-25 ENCOUNTER — Encounter: Payer: Self-pay | Admitting: Adult Health

## 2016-04-25 VITALS — BP 126/72 | Temp 97.8°F | Ht 71.0 in | Wt 189.4 lb

## 2016-04-25 DIAGNOSIS — Z7689 Persons encountering health services in other specified circumstances: Secondary | ICD-10-CM

## 2016-04-25 DIAGNOSIS — E119 Type 2 diabetes mellitus without complications: Secondary | ICD-10-CM

## 2016-04-25 DIAGNOSIS — Z7189 Other specified counseling: Secondary | ICD-10-CM

## 2016-04-25 DIAGNOSIS — E785 Hyperlipidemia, unspecified: Secondary | ICD-10-CM

## 2016-04-25 DIAGNOSIS — I10 Essential (primary) hypertension: Secondary | ICD-10-CM | POA: Diagnosis not present

## 2016-04-25 DIAGNOSIS — Z760 Encounter for issue of repeat prescription: Secondary | ICD-10-CM

## 2016-04-25 LAB — POCT GLYCOSYLATED HEMOGLOBIN (HGB A1C): HEMOGLOBIN A1C: 6.3

## 2016-04-25 MED ORDER — LOSARTAN POTASSIUM 50 MG PO TABS: 50.0000 mg | ORAL_TABLET | Freq: Every day | ORAL | 3 refills | Status: DC

## 2016-04-25 MED ORDER — CLOPIDOGREL BISULFATE 75 MG PO TABS: 75.0000 mg | ORAL_TABLET | Freq: Every day | ORAL | 3 refills | Status: DC

## 2016-04-25 NOTE — Patient Instructions (Addendum)
It was a pleasure meeting you today   Your A1c is 6.3  Please try and get outside more often!   I will see you for your physical   I would also like for you to sign up for an annual wellness visit on a Friday with our nurse Manuela Schwartz. This is a free benefit under medicare that may help [place] find additional ways to help you.

## 2016-04-25 NOTE — Progress Notes (Signed)
Patient presents to clinic today to establish care.He is a pleasant 70 year old male who  has a past medical history of Depression; Diabetes mellitus, type II; ED (erectile dysfunction); History of cerebrovascular accident; Hyperlipidemia; and Stroke.   His last physical was in September 2016 by Dr. Burnice Logan.    Acute Concerns: Establish Care  Chronic Issues: DM - Well controlled with diet. Checks blood sugars at home and does not report any highs or lows  Hyperlipidemia  - Well controlled on statin  CVA He began having cognitive problems after his first stroke in 2006. There was another sharp decline after his second stroke in 2014. It has not been a gradual progression. He primarily has short-term memory impairment. He often repeats questions and forgets conversations quickly. He will not remember where he placed objects such as his wallet. He will take him a little time to recall the names of people that he hasn't seen a little while. However, he has no difficulty remembering names of close friends and family.   His wife works during the day and Mr. Kreiner stays at home. He does not get out of the house much during the day. There is no concern for wondering behavior. There son will take Mr. Cease finishing on a regular basis.   Depression  - Feels as though it is well controlled.   Health Maintenance: Dental -- Dr. Conception Chancy. Routine care Vision -- Routine  Immunizations -- Colonoscopy -- 2012 - Normal  Diet: Tries to eat healthy. Not a lot of fried foods, no red meat and no pork.  Exercise: Does not do any formal exercise.   He is not seen by Neurology anympore     Past Medical History:  Diagnosis Date  . Depression   . Diabetes mellitus, type II   . ED (erectile dysfunction)   . History of cerebrovascular accident   . Hyperlipidemia   . Stroke     Past Surgical History:  Procedure Laterality Date  . COLONOSCOPY  1990's   [facility] [address]  . TEE WITHOUT  CARDIOVERSION N/A 01/01/2013   Procedure: TRANSESOPHAGEAL ECHOCARDIOGRAM (TEE);  Surgeon: Larey Dresser, MD;  Location[facility] ENDOSCOPY;  Service: Cardiovascular;  Laterality: N/A;    Current Outpatient Prescriptions on File Prior to Visit  Medication Sig Dispense Refill  . alfuzosin (UROXATRAL) 10 MG 24 hr tablet Take 10 mg by mouth daily with breakfast.   0  . amLODipine (NORVASC) 10 MG tablet Take 1 tablet (10 mg total) by mouth daily. 90 tablet 0  . clopidogrel (PLAVIX) 75 MG tablet take 1 tablet by mouth once daily 90 tablet 0  . finasteride (PROSCAR) 5 MG tablet Take 1 tablet (5 mg total) by mouth daily. 90 tablet 0  . losartan (COZAAR) 50 MG tablet take 1 tablet by mouth once daily 90 tablet 0  . simvastatin (ZOCOR) 20 MG tablet take 1 tablet by mouth once daily AT 6PM 90 tablet 0  . tamsulosin (FLOMAX) 0.4 MG CAPS capsule Take 1 capsule (0.4 mg total) by mouth daily after supper. 90 capsule 0  . venlafaxine XR (EFFEXOR-XR) 37.5 MG 24 hr capsule Take 1 capsule (37.5 mg total) by mouth daily with breakfast. 90 capsule 0   No current facility-administered medications on file prior to visit.     Allergies  Allergen Reactions  . Poliovirus Vaccine Inactivated     Heart races, cold sweats, "almost killed me".  . Aspirin     Gi upset  .  Penicillins Swelling, Palpitations and Rash    Family History  Problem Relation Age of Onset  . Heart disease Mother   . Other      no family history of colon cancer    Social History   Social History  . Marital status: Married    Spouse name: N/A  . Number of children: N/A  . Years of education: N/A   Occupational History  . Not on file.   Social History Main Topics  . Smoking status: Former Smoker    Packs/day: 1.00    Quit date: 02/15/2013  . Smokeless tobacco: Not on file  . Alcohol use No  . Drug use:     Types: Marijuana     Comment: marijuana  . Sexual activity: Yes    Partners: Female   Other Topics Concern  . Not on file    Social History Narrative   Retired   Married   Current Smoker    Alcohol use-no         son [name] living in [place] ([place]) -  engaged to be married    Review of Systems  Constitutional: Negative.   Respiratory: Negative.   Cardiovascular: Negative.   Skin: Negative.   Neurological: Negative.        Short term memory loss  All other systems reviewed and are negative.   There were no vitals taken for this visit.  Physical Exam  Constitutional: He is oriented to person, place, and time and well-developed, well-nourished, and in no distress. No distress.  HENT:  Head: Normocephalic and atraumatic.  Right Ear: External ear normal.  Left Ear: External ear normal.  Nose: Nose normal.  Mouth/Throat: Oropharynx is clear and moist. No oropharyngeal exudate.  Eyes: Conjunctivae and EOM are normal. Pupils are equal, round, and reactive to light. Left eye exhibits no discharge.  Cardiovascular: Normal rate, regular rhythm, normal heart sounds and intact distal pulses.  Exam reveals no gallop and no friction rub.   No murmur heard. Pulmonary/Chest: Effort normal and breath sounds normal. No respiratory distress. He has no wheezes. He has no rales. He exhibits no tenderness.  Neurological: He is alert and oriented to person, place, and time. Gait normal. GCS score is 15.  Skin: Skin is warm and dry. No rash noted. He is not diaphoretic. No erythema. No pallor.  Psychiatric: Mood, memory, affect and judgment normal.  Vitals reviewed.   Assessment/Plan: 1. Encounter to establish care - Follow up with me for CPE - Follow up with susan for AWE - Encouraged him to get out of the house more often in the evening when his wife is home - Encouraged to eat healthy   2. Essential hypertension - losartan (COZAAR) 50 MG tablet; Take 1 tablet (50 mg total) by mouth daily.  Dispense: 90 tablet; Refill: 3  3. Diabetes mellitus without complication ([place]) - POC A1c - 6.3  - Controlled  with diet  4. Hyperlipidemia - Well controlled on Zocor   5. Medication refill - losartan (COZAAR) 50 MG tablet; Take 1 tablet (50 mg total) by mouth daily.  Dispense: 90 tablet; Refill: 3 - clopidogrel (PLAVIX) 75 MG tablet; Take 1 tablet (75 mg total) by mouth daily.  Dispense: 90 tablet; Refill: 3   Dorothyann Peng, NP

## 2016-05-16 ENCOUNTER — Other Ambulatory Visit: Payer: Self-pay | Admitting: Adult Health

## 2016-05-16 DIAGNOSIS — E559 Vitamin D deficiency, unspecified: Secondary | ICD-10-CM

## 2016-05-16 MED ORDER — ALFUZOSIN HCL ER 10 MG PO TB24: 10.0000 mg | ORAL_TABLET | Freq: Every day | ORAL | 3 refills | Status: DC

## 2016-05-16 MED ORDER — VENLAFAXINE HCL ER 37.5 MG PO CP24: 37.5000 mg | ORAL_CAPSULE | Freq: Every day | ORAL | 1 refills | Status: DC

## 2016-05-16 MED ORDER — SIMVASTATIN 20 MG PO TABS: ORAL_TABLET | ORAL | 0 refills | Status: DC

## 2016-05-16 MED ORDER — AMLODIPINE BESYLATE 10 MG PO TABS: 10.0000 mg | ORAL_TABLET | Freq: Every day | ORAL | 0 refills | Status: DC

## 2016-06-27 ENCOUNTER — Encounter: Payer: Self-pay | Admitting: Adult Health

## 2016-06-27 ENCOUNTER — Ambulatory Visit (INDEPENDENT_AMBULATORY_CARE_PROVIDER_SITE_OTHER): Payer: Medicare Other | Admitting: Adult Health

## 2016-06-27 VITALS — BP 126/82 | Temp 98.6°F | Ht 71.0 in | Wt 192.4 lb

## 2016-06-27 DIAGNOSIS — N400 Enlarged prostate without lower urinary tract symptoms: Secondary | ICD-10-CM

## 2016-06-27 DIAGNOSIS — E119 Type 2 diabetes mellitus without complications: Secondary | ICD-10-CM

## 2016-06-27 DIAGNOSIS — E785 Hyperlipidemia, unspecified: Secondary | ICD-10-CM

## 2016-06-27 DIAGNOSIS — I10 Essential (primary) hypertension: Secondary | ICD-10-CM

## 2016-06-27 LAB — POC URINALSYSI DIPSTICK (AUTOMATED)
Bilirubin, UA: NEGATIVE
Glucose, UA: NEGATIVE
Ketones, UA: NEGATIVE
LEUKOCYTES UA: NEGATIVE
NITRITE UA: NEGATIVE
PH UA: 5
Spec Grav, UA: >=1.03
Urobilinogen, UA: 0.2

## 2016-06-27 NOTE — Progress Notes (Signed)
Subjective:    Patient ID: Sean Myers, male    DOB: November 24, 1945, 70 y.o.   MRN: [id number]  HPI   Patient presents for yearly preventative medicine examination he  has a past medical history of Depression; Diabetes mellitus, type II [facility]); ED (erectile dysfunction); History of cerebrovascular accident; Hyperlipidemia; and Stroke ([place]).  All immunizations and health maintenance protocols were reviewed with the patient and needed orders were placed.  Appropriate screening laboratory values were ordered for the patient including screening of hyperlipidemia, renal function and hepatic function. If indicated by BPH, a PSA was ordered.  Medication reconciliation,  past medical history, social history, problem list and allergies were reviewed in detail with the patient  Goals were established with regard to weight loss, exercise, and  diet in compliance with medications  End of life planning was discussed. He has an advanced directive and living will  He is up to date on his colonoscopy. He does routine eye and dental care.   His wife is concerned that he stays confined to the inside of the home during the day while she is at work. This is complicated by his previous stroke and he is unable to drive.   He has been told by neurology to follow-up as needed  His hypertension has been well controlled as well as his diabetes. He remains on a statin therapy and on Plavix for secondary stroke prevention    Review of Systems  Constitutional: Negative.   HENT: Negative.   Eyes: Negative.   Respiratory: Negative.   Cardiovascular: Negative.   Gastrointestinal: Negative.   Endocrine: Negative.   Genitourinary: Negative.   Musculoskeletal: Negative.   Skin: Negative.   Allergic/Immunologic: Negative.   Neurological: Negative.   Hematological: Negative.   Psychiatric/Behavioral: Negative.   All other systems reviewed and are negative.  Past Medical History:  Diagnosis Date  .  Depression   . Diabetes mellitus, type II ([place])   . ED (erectile dysfunction)   . History of cerebrovascular accident   . Hyperlipidemia   . Stroke [facility])     Social History   Social History  . Marital status: Married    Spouse name: N/A  . Number of children: N/A  . Years of education: N/A   Occupational History  . Not on file.   Social History Main Topics  . Smoking status: Former Smoker    Packs/day: 1.00    Quit date: 02/15/2013  . Smokeless tobacco: Not on file  . Alcohol use No  . Drug use:     Types: Marijuana     Comment: marijuana  . Sexual activity: Yes    Partners: Female   Other Topics Concern  . Not on file   Social History Narrative   Retired   Married   Current Smoker    Alcohol use-no         son Sean Myers living in [place] ([place]) -  engaged to be married    Past Surgical History:  Procedure Laterality Date  . COLONOSCOPY  1990's   [facility]  . TEE WITHOUT CARDIOVERSION N/A 01/01/2013   Procedure: TRANSESOPHAGEAL ECHOCARDIOGRAM (TEE);  Surgeon: Larey Dresser, MD;  Location: [facility] ENDOSCOPY;  Service: Cardiovascular;  Laterality: N/A;    Family History  Problem Relation Age of Onset  . Heart disease Mother   . Other      no family history of colon cancer    Allergies  Allergen Reactions  . Poliovirus Vaccine Inactivated  Heart races, cold sweats, "almost killed me".  . Aspirin     Gi upset  . Penicillins Swelling, Palpitations and Rash    Current Outpatient Prescriptions on File Prior to Visit  Medication Sig Dispense Refill  . alfuzosin (UROXATRAL) 10 MG 24 hr tablet Take 1 tablet (10 mg total) by mouth daily with breakfast. 90 tablet 3  . amLODipine (NORVASC) 10 MG tablet Take 1 tablet (10 mg total) by mouth daily. 90 tablet 0  . simvastatin (ZOCOR) 20 MG tablet take 1 tablet by mouth once daily AT 6PM 90 tablet 0  . venlafaxine XR (EFFEXOR XR) 37.5 MG 24 hr capsule Take 1 capsule (37.5 mg total) by mouth daily with  breakfast. 90 capsule 1  . clopidogrel (PLAVIX) 75 MG tablet Take 1 tablet (75 mg total) by mouth daily. (Patient not taking: Reported on 06/27/2016) 90 tablet 3  . losartan (COZAAR) 50 MG tablet Take 1 tablet (50 mg total) by mouth daily. (Patient not taking: Reported on 06/27/2016) 90 tablet 3   No current facility-administered medications on file prior to visit.     BP 126/82   Temp 98.6 F (37 C) (Oral)   Ht 5\' 11"  (1.803 m)   Wt 192 lb 6.4 oz (87.3 kg)   BMI 26.83 kg/m       Objective:   Physical Exam  Constitutional: He is oriented to person, place, and time. He appears well-developed and well-nourished. No distress.  HENT:  Head: Normocephalic and atraumatic.  Right Ear: External ear normal.  Left Ear: External ear normal.  Nose: Nose normal.  Mouth/Throat: Oropharynx is clear and moist. No oropharyngeal exudate.  Eyes: Conjunctivae and EOM are normal. Pupils are equal, round, and reactive to light. Right eye exhibits no discharge. Left eye exhibits no discharge. No scleral icterus.  Neck: Normal range of motion. Neck supple. No JVD present. Carotid bruit is not present. No tracheal deviation present. No thyromegaly present.  Cardiovascular: Normal rate, regular rhythm, normal heart sounds and intact distal pulses.  Exam reveals no gallop and no friction rub.   No murmur heard. Pulmonary/Chest: Effort normal and breath sounds normal. No stridor. No respiratory distress. He has no wheezes. He has no rales. He exhibits no tenderness.  Abdominal: Soft. Normal appearance, normal aorta and bowel sounds are normal. He exhibits no distension and no mass. There is no hepatosplenomegaly, splenomegaly or hepatomegaly. There is no tenderness. There is no rebound, no guarding and no CVA tenderness.  Genitourinary: Rectum normal and prostate normal. Rectal exam shows guaiac negative stool.  Musculoskeletal: Normal range of motion. He exhibits no edema, tenderness or deformity.    Lymphadenopathy:    He has no cervical adenopathy.  Neurological: He is alert and oriented to person, place, and time. He has normal strength and normal reflexes. He displays normal reflexes. No cranial nerve deficit or sensory deficit. He exhibits normal muscle tone. Coordination and gait normal.  Skin: Skin is warm and dry. No rash noted. He is not diaphoretic. No erythema. No pallor. Nails show clubbing.  Psychiatric: He has a normal mood and affect. His behavior is normal. Judgment and thought content normal.  Nursing note and vitals reviewed.      Assessment & Plan:  1. Hyperlipidemia, unspecified hyperlipidemia type - Taking simvastatin 20 mg. Has been well controlled in the past. Will consider going up on dose - EKG [id number] - Basic metabolic panel - CBC with Differential/Platelet - Hemoglobin A1c - Hepatic function panel - Lipid panel -  POCT Urinalysis Dipstick (Automated) - PSA - TSH  2. Benign prostatic hyperplasia without lower urinary tract symptoms  - Basic metabolic panel - CBC with Differential/Platelet - Hemoglobin A1c - Hepatic function panel - Lipid panel - POCT Urinalysis Dipstick (Automated) - PSA - TSH  3. Diabetes mellitus without complication ([place]) - Diet controlled - Basic metabolic panel - CBC with Differential/Platelet - Hemoglobin A1c - Hepatic function panel - Lipid panel - POCT Urinalysis Dipstick (Automated) - PSA - TSH - Ambulatory referral to Podiatry  4. Essential hypertension - Well-controlled area and no change - EKG 12-Lead-Sinus  Rhythm  -Prominent R(V1) and left axis -nonspecific  -Seen with pulmonary disease -possible anterior fascicular block.   -Old anterior infarct.   -  Nonspecific T-abnormality.  -  Rate 75   - Basic metabolic panel - CBC with Differential/Platelet - Hemoglobin A1c - Hepatic function panel - Lipid panel - POCT Urinalysis Dipstick (Automated) - PSA - TSH  Dorothyann Peng, NP

## 2016-06-27 NOTE — Patient Instructions (Signed)
It was great seeing you today  Someone from podiatry will call you to schedule an appointment   I will follow up with you regarding your blood work   Please look into groups to join in order to get out of the house

## 2016-06-28 LAB — BASIC METABOLIC PANEL
BUN: 10 mg/dL (ref 6–23)
CHLORIDE: 108 meq/L (ref 96–112)
CO2: 27 meq/L (ref 19–32)
CREATININE: 1.04 mg/dL (ref 0.40–1.50)
Calcium: 9 mg/dL (ref 8.4–10.5)
GFR: 90.51 mL/min (ref >60.00)
Glucose, Bld: 122 mg/dL — ABNORMAL HIGH (ref 70–99)
Potassium: 3.9 mEq/L (ref 3.5–5.1)
Sodium: 142 mEq/L (ref 135–145)

## 2016-06-28 LAB — PSA: PSA: 7.41 ng/mL — ABNORMAL HIGH (ref 0.10–4.00)

## 2016-06-28 LAB — CBC WITH DIFFERENTIAL/PLATELET
BASOS PCT: 0.4 % (ref 0.0–3.0)
Basophils Absolute: 0 10*3/uL (ref 0.0–0.1)
EOS ABS: 0.1 10*3/uL (ref 0.0–0.7)
Eosinophils Relative: 1.6 % (ref 0.0–5.0)
HEMATOCRIT: 44.3 % (ref 39.0–52.0)
Hemoglobin: 14.3 g/dL (ref 13.0–17.0)
LYMPHS PCT: 37.4 % (ref 12.0–46.0)
Lymphs Abs: 2.4 10*3/uL (ref 0.7–4.0)
MCHC: 32.2 g/dL (ref 30.0–36.0)
MCV: 82.9 fl (ref 78.0–100.0)
Monocytes Absolute: 0.5 10*3/uL (ref 0.1–1.0)
Monocytes Relative: 8 % (ref 3.0–12.0)
NEUTROS ABS: 3.4 10*3/uL (ref 1.4–7.7)
Neutrophils Relative %: 52.6 % (ref 43.0–77.0)
PLATELETS: 237 10*3/uL (ref 150.0–400.0)
RBC: 5.34 Mil/uL (ref 4.22–5.81)
RDW: 15.9 % — AB (ref 11.5–15.5)
WBC: 6.4 10*3/uL (ref 4.0–10.5)

## 2016-06-28 LAB — HEPATIC FUNCTION PANEL
ALBUMIN: 4 g/dL (ref 3.5–5.2)
ALT: 13 U/L (ref 0–53)
AST: 14 U/L (ref 0–37)
Alkaline Phosphatase: 79 U/L (ref 39–117)
BILIRUBIN TOTAL: 0.3 mg/dL (ref 0.2–1.2)
Bilirubin, Direct: 0.1 mg/dL (ref 0.0–0.3)
TOTAL PROTEIN: 7.1 g/dL (ref 6.0–8.3)

## 2016-06-28 LAB — LIPID PANEL
CHOLESTEROL: 142 mg/dL (ref 0–200)
HDL: 49.4 mg/dL (ref >39.00)
LDL Cholesterol: 78 mg/dL (ref 0–99)
NonHDL: 93.06
TRIGLYCERIDES: 75 mg/dL (ref 0.0–149.0)
Total CHOL/HDL Ratio: 3
VLDL: 15 mg/dL (ref 0.0–40.0)

## 2016-06-28 LAB — TSH: TSH: 0.54 u[IU]/mL (ref 0.35–4.50)

## 2016-06-28 LAB — HEMOGLOBIN A1C: Hgb A1c MFr Bld: 6.3 % (ref 4.6–6.5)

## 2016-07-01 ENCOUNTER — Other Ambulatory Visit: Payer: Medicare Other

## 2016-07-11 ENCOUNTER — Other Ambulatory Visit: Payer: Self-pay | Admitting: Adult Health

## 2016-07-11 DIAGNOSIS — R972 Elevated prostate specific antigen [PSA]: Secondary | ICD-10-CM

## 2016-07-17 ENCOUNTER — Ambulatory Visit: Payer: Medicare Other | Admitting: Podiatry

## 2016-07-30 ENCOUNTER — Ambulatory Visit: Payer: Medicare Other | Admitting: Podiatry

## 2016-08-15 ENCOUNTER — Ambulatory Visit (INDEPENDENT_AMBULATORY_CARE_PROVIDER_SITE_OTHER): Payer: Medicare Other | Admitting: Podiatry

## 2016-08-15 ENCOUNTER — Encounter: Payer: Self-pay | Admitting: Podiatry

## 2016-08-15 VITALS — BP 126/78 | HR 72 | Resp 16

## 2016-08-15 DIAGNOSIS — B351 Tinea unguium: Secondary | ICD-10-CM

## 2016-08-15 DIAGNOSIS — M79674 Pain in right toe(s): Secondary | ICD-10-CM | POA: Diagnosis not present

## 2016-08-15 DIAGNOSIS — M79675 Pain in left toe(s): Secondary | ICD-10-CM

## 2016-08-15 NOTE — Progress Notes (Signed)
   Subjective:    Patient ID: Sean Myers, male    DOB: [birth year], 70 y.o.   MRN: [id number]  HPI 70 year old male presents to the office today for concerns of very long, thick toenails. He is unsure of when they were last cut. They are causing pressure in shoes but denies any redness or drainage. He previously had the right hallux toenail removed. No other complaints today.   Review of Systems  Musculoskeletal: Positive for gait problem.  All other systems reviewed and are negative.      Objective:   Physical Exam General: AAO x3, NAD  Dermatological: Nails are hypertrophic, dystrophic, brittle, yellow-brown discolored,and very elongated 9. No surrounding redness or drainage. Tenderness nails 1-5 bilaterally except the right hallux nail which has been removed previously. No open lesions or pre-ulcerative lesions are identified today.   Vascular: Dorsalis Pedis artery and Posterior Tibial artery pedal pulses are 2/4 bilateral with immedate capillary fill time.  There is no pain with calf compression, swelling, warmth, erythema.   Neruologic: Grossly intact via light touch bilateral. Vibratory intact via tuning fork bilateral. Protective threshold with Semmes Wienstein monofilament intact to all pedal sites bilateral.   Musculoskeletal: No gross boney pedal deformities bilateral. No pain, crepitus, or limitation noted with foot and ankle range of motion bilateral. Muscular strength 5/5 in all groups tested bilateral.  Gait: Unassisted, Nonantalgic.      Assessment & Plan:  70 year old male with symptomatic onychomycosis -Treatment options discussed including all alternatives, risks, and complications -Etiology of symptoms were discussed -Nails debrided 9 without complications or bleeding. -discussed treatment options for nail fungus including Lamisil, toenail removal, topical medication. They were unsure if they (he and his wife) wanted to have any other treatment. Dicussed  periodic nail debridements.  -Daily foot inspection -Follow-up in 3 months or sooner if any problems arise. In the meantime, encouraged to call the office with any questions, concerns, change in symptoms.   Celesta Gentile, DPM

## 2016-09-12 ENCOUNTER — Other Ambulatory Visit: Payer: Self-pay | Admitting: Adult Health

## 2016-09-13 ENCOUNTER — Other Ambulatory Visit: Payer: Self-pay | Admitting: Adult Health

## 2016-09-13 MED ORDER — AMLODIPINE BESYLATE 10 MG PO TABS: 10.0000 mg | ORAL_TABLET | Freq: Every day | ORAL | 3 refills | Status: DC

## 2016-10-20 ENCOUNTER — Other Ambulatory Visit: Payer: Self-pay | Admitting: Adult Health

## 2016-11-13 ENCOUNTER — Ambulatory Visit: Payer: Medicare Other | Admitting: Podiatry

## 2017-01-03 ENCOUNTER — Other Ambulatory Visit: Payer: Self-pay | Admitting: Adult Health

## 2017-01-03 NOTE — Telephone Encounter (Signed)
Ok to refill 

## 2017-01-03 NOTE — Telephone Encounter (Signed)
Ok to refill for one year  

## 2017-02-11 ENCOUNTER — Other Ambulatory Visit: Payer: Self-pay | Admitting: Adult Health

## 2017-02-11 NOTE — Telephone Encounter (Signed)
Please advise on refill.

## 2017-02-11 NOTE — Telephone Encounter (Signed)
Ok to refill for 6 months 

## 2017-05-16 ENCOUNTER — Encounter: Payer: Self-pay | Admitting: Adult Health

## 2017-06-06 ENCOUNTER — Other Ambulatory Visit: Payer: Self-pay | Admitting: Adult Health

## 2017-06-10 NOTE — Telephone Encounter (Signed)
Left a message on cell for pt to call back.  Needs cpx/yearly on or after 06/27/17

## 2017-06-12 ENCOUNTER — Encounter: Payer: Self-pay | Admitting: Family Medicine

## 2017-06-12 NOTE — Telephone Encounter (Signed)
Tried to reach the pt by telephone.  Left a message for a return call.  Will now close and send a letter.

## 2017-07-18 ENCOUNTER — Other Ambulatory Visit: Payer: Self-pay | Admitting: Adult Health

## 2017-07-22 NOTE — Telephone Encounter (Signed)
Left a message for a return call.  Pt now due for yearly med check.

## 2017-08-06 ENCOUNTER — Encounter: Payer: Self-pay | Admitting: Family Medicine

## 2017-08-06 NOTE — Telephone Encounter (Signed)
Tried to reach the pt.  Do not believe the home phone is in working order.  Cell phone belongs to a woman but not identified.  Does not match cell phone of wife in the system.  Sent in 30 day supply.  Will send a letter to inform pt that he is due for cpx and fasting lab work.

## 2017-09-11 ENCOUNTER — Encounter: Payer: Self-pay | Admitting: Adult Health

## 2017-09-11 ENCOUNTER — Ambulatory Visit (INDEPENDENT_AMBULATORY_CARE_PROVIDER_SITE_OTHER): Payer: Medicare Other | Admitting: Adult Health

## 2017-09-11 VITALS — BP 120/80 | HR 64 | Temp 98.1°F | Ht 73.0 in | Wt 195.7 lb

## 2017-09-11 DIAGNOSIS — F0150 Vascular dementia without behavioral disturbance: Secondary | ICD-10-CM | POA: Diagnosis not present

## 2017-09-11 DIAGNOSIS — E119 Type 2 diabetes mellitus without complications: Secondary | ICD-10-CM | POA: Diagnosis not present

## 2017-09-11 DIAGNOSIS — Z23 Encounter for immunization: Secondary | ICD-10-CM

## 2017-09-11 DIAGNOSIS — N400 Enlarged prostate without lower urinary tract symptoms: Secondary | ICD-10-CM | POA: Diagnosis not present

## 2017-09-11 DIAGNOSIS — Z1159 Encounter for screening for other viral diseases: Secondary | ICD-10-CM

## 2017-09-11 DIAGNOSIS — I10 Essential (primary) hypertension: Secondary | ICD-10-CM

## 2017-09-11 DIAGNOSIS — I639 Cerebral infarction, unspecified: Secondary | ICD-10-CM | POA: Diagnosis not present

## 2017-09-11 DIAGNOSIS — F3289 Other specified depressive episodes: Secondary | ICD-10-CM

## 2017-09-11 DIAGNOSIS — E785 Hyperlipidemia, unspecified: Secondary | ICD-10-CM

## 2017-09-11 DIAGNOSIS — Z760 Encounter for issue of repeat prescription: Secondary | ICD-10-CM | POA: Diagnosis not present

## 2017-09-11 MED ORDER — AMLODIPINE BESYLATE 10 MG PO TABS: 10.0000 mg | ORAL_TABLET | Freq: Every day | ORAL | 3 refills | Status: DC

## 2017-09-11 MED ORDER — VENLAFAXINE HCL ER 37.5 MG PO CP24: ORAL_CAPSULE | ORAL | 1 refills | Status: DC

## 2017-09-11 MED ORDER — CLOPIDOGREL BISULFATE 75 MG PO TABS: 75.0000 mg | ORAL_TABLET | Freq: Every day | ORAL | 3 refills | Status: DC

## 2017-09-11 MED ORDER — LOSARTAN POTASSIUM 50 MG PO TABS: 50.0000 mg | ORAL_TABLET | Freq: Every day | ORAL | 3 refills | Status: DC

## 2017-09-11 MED ORDER — SIMVASTATIN 20 MG PO TABS: ORAL_TABLET | ORAL | 3 refills | Status: DC

## 2017-09-11 MED ORDER — ALFUZOSIN HCL ER 10 MG PO TB24: ORAL_TABLET | ORAL | 3 refills | Status: DC

## 2017-09-11 NOTE — Progress Notes (Signed)
Subjective:    Patient ID: Sean Myers, male    DOB: [birth year], 72 y.o.   MRN: [id number]  HPI  Patient presents for yearly preventative medicine examination. He is a pleasant 72 year old male who  has a past medical history of Depression, Diabetes mellitus, type II ([place]), ED (erectile dysfunction), History of cerebrovascular accident, Hyperlipidemia, and Stroke ([place]).   He takes Norvasc 10 mg Cozaar 50 mg daily for hypertension- stable   Takes simvastatin 20 mg for hyperlipidemia- stable   Takes Plavix 75 mg due to history of CVA- stable  Takes Effexor 37.5 mg for depression- his wife reports that he has not been taking Effexor for some time now.  All immunizations and health maintenance protocols were reviewed with the patient and needed orders were placed.  Appropriate screening laboratory values were ordered for the patient including screening of hyperlipidemia, renal function and hepatic function. If indicated by BPH, a PSA was ordered.  Medication reconciliation,  past medical history, social history, problem list and allergies were reviewed in detail with the patient  Goals were established with regard to weight loss, exercise, and  diet in compliance with medications  End of life planning was discussed. He has an advanced directive and living will.   He is up-to-date on his colonoscopy.  He is not up-to-date on dental and vision exams  His wife, who was present during this exam reports that she feels as though Juanda Crumble short-term memory is starting to diminish slowly.  Per her report he will often be asked a question cannot think the answer.  She also reports that she will often ask him to do something such as turn down the television, and she will walk away for a few minutes and come back and he cannot remember what she had asked of him.  She feels as though his long-term memory remains normal.  Addition to this she reports that Nohlan will often sleep until 1 or 2:00 in  the morning and then he sits on the couch or stares out the window throughout the day.  She feels as though this could be a cause of depression and not taken Effexor she would like to restart Effexor and also be evaluated by neuropsychiatry due to cognitive impairment.  Review of Systems  Constitutional: Negative.   HENT: Negative.   Eyes: Negative.   Respiratory: Negative.   Cardiovascular: Negative.   Gastrointestinal: Negative.   Endocrine: Negative.   Genitourinary: Negative.   Musculoskeletal: Negative.   Skin: Negative.   Allergic/Immunologic: Negative.   Neurological: Negative.   Hematological: Negative.   Psychiatric/Behavioral: Positive for confusion, decreased concentration and sleep disturbance.  All other systems reviewed and are negative.  Past Medical History:  Diagnosis Date  . Depression   . Diabetes mellitus, type II ([place])   . ED (erectile dysfunction)   . History of cerebrovascular accident   . Hyperlipidemia   . Stroke [facility])     Social History   Socioeconomic History  . Marital status: Married    Spouse name: Not on file  . Number of children: Not on file  . Years of education: Not on file  . Highest education level: Not on file  Social Needs  . Financial resource strain: Not on file  . Food insecurity - worry: Not on file  . Food insecurity - inability: Not on file  . Transportation needs - medical: Not on file  . Transportation needs - non-medical: Not on file  Occupational  History  . Not on file  Tobacco Use  . Smoking status: Former Smoker    Packs/day: 1.00    Last attempt to quit: 02/15/2013    Years since quitting: 4.5  . Smokeless tobacco: Never Used  Substance and Sexual Activity  . Alcohol use: No  . Drug use: Yes    Types: Marijuana    Comment: marijuana  . Sexual activity: Yes    Partners: Female  Other Topics Concern  . Not on file  Social History Narrative   Retired   Married   Current Smoker    Alcohol use-no          son [name] living in [place] ([place]) -  engaged to be married    Past Surgical History:  Procedure Laterality Date  . COLONOSCOPY  1990's   [facility]  . TEE WITHOUT CARDIOVERSION N/A 01/01/2013   Procedure: TRANSESOPHAGEAL ECHOCARDIOGRAM (TEE);  Surgeon: Larey Dresser, MD;  Location: [facility] ENDOSCOPY;  Service: Cardiovascular;  Laterality: N/A;    Family History  Problem Relation Age of Onset  . Heart disease Mother   . Other Unknown        no family history of colon cancer    Allergies  Allergen Reactions  . Poliovirus Vaccine Inactivated     Heart races, cold sweats, "almost killed me".  . Aspirin     Gi upset  . Penicillins Swelling, Palpitations and Rash    Current Outpatient Medications on File Prior to Visit  Medication Sig Dispense Refill  . alfuzosin (UROXATRAL) 10 MG 24 hr tablet take 1 tablet by mouth once daily WITH BREAKFAST 30 tablet 0  . amLODipine (NORVASC) 10 MG tablet Take 1 tablet (10 mg total) by mouth daily. 90 tablet 3  . clopidogrel (PLAVIX) 75 MG tablet Take 1 tablet (75 mg total) by mouth daily. 90 tablet 3  . losartan (COZAAR) 50 MG tablet Take 1 tablet (50 mg total) by mouth daily. 90 tablet 3  . simvastatin (ZOCOR) 20 MG tablet TAKE 1 TABLET BY MOUTH DAILY AT 6PM 90 tablet 1  . venlafaxine XR (EFFEXOR-XR) 37.5 MG 24 hr capsule take 1 capsule by mouth once daily WITH BREAKFAST (Patient not taking: Reported on 09/11/2017) 90 capsule 3   No current facility-administered medications on file prior to visit.     BP 120/80 (BP Location: Left Arm, Patient Position: Sitting, Cuff Size: Normal)   Pulse 64   Temp 98.1 F (36.7 C) (Oral)   Ht 6\' 1"  (1.854 m)   Wt 195 lb 11.2 oz (88.8 kg)   SpO2 98%   BMI 25.82 kg/m       Objective:   Physical Exam  Constitutional: He is oriented to person, place, and time. He appears well-developed and well-nourished. No distress.  HENT:  Head: Normocephalic and atraumatic.  Right Ear: External ear normal.    Left Ear: External ear normal.  Nose: Nose normal.  Mouth/Throat: Oropharynx is clear and moist. No oropharyngeal exudate.  Eyes: Conjunctivae and EOM are normal. Pupils are equal, round, and reactive to light. Right eye exhibits no discharge. Left eye exhibits no discharge. No scleral icterus.  Neck: Normal range of motion. Neck supple. No JVD present. No tracheal deviation present. No thyromegaly present.  Cardiovascular: Normal rate, regular rhythm, normal heart sounds and intact distal pulses. Exam reveals no gallop and no friction rub.  No murmur heard. Pulmonary/Chest: Effort normal and breath sounds normal. No stridor. No respiratory distress. He has no  wheezes. He has no rales. He exhibits no tenderness.  Abdominal: Soft. Bowel sounds are normal. He exhibits no distension and no mass. There is no tenderness. There is no rebound and no guarding.  Genitourinary:  Genitourinary Comments: Deferred: Will do PSA  Musculoskeletal: Normal range of motion. He exhibits no edema, tenderness or deformity.  Lymphadenopathy:    He has no cervical adenopathy.  Neurological: He is alert and oriented to person, place, and time. He has normal strength and normal reflexes. He displays normal reflexes. No cranial nerve deficit or sensory deficit. Coordination normal.  He is seems forgetful during the exam today and often looks to his wife to answer questions for him  Skin: Skin is warm and dry. No rash noted. He is not diaphoretic. No erythema. No pallor.  Psychiatric: He has a normal mood and affect. His behavior is normal. Judgment normal.  Nursing note and vitals reviewed.     Assessment & Plan:  1. Cerebellar stroke, acute ([place]) -Continue with Plavix and statin - Basic metabolic panel - CBC with Differential/Platelet - Hemoglobin A1c - Hepatic function panel - Lipid panel - TSH - PSA - clopidogrel (PLAVIX) 75 MG tablet; Take 1 tablet (75 mg total) by mouth daily.  Dispense: 90 tablet;  Refill: 3  2. Essential hypertension -Well controlled no change in medication - Basic metabolic panel - CBC with Differential/Platelet - Hemoglobin A1c - Hepatic function panel - Lipid panel - TSH - PSA - amLODipine (NORVASC) 10 MG tablet; Take 1 tablet (10 mg total) by mouth daily.  Dispense: 90 tablet; Refill: 3 - losartan (COZAAR) 50 MG tablet; Take 1 tablet (50 mg total) by mouth daily.  Dispense: 90 tablet; Refill: 3  3. Diabetes mellitus without complication (HCC) -Diet controlled in the past.  Consider metformin - Basic metabolic panel - CBC with Differential/Platelet - Hemoglobin A1c - Hepatic function panel - Lipid panel - TSH - PSA  4. Vascular dementia without behavioral disturbance -We will refer to neuropsych further evaluation - Basic metabolic panel - CBC with Differential/Platelet - Hemoglobin A1c - Hepatic function panel - Lipid panel - TSH - PSA - Ambulatory referral to Neurology  5. Benign prostatic hyperplasia without lower urinary tract symptoms  - Basic metabolic panel - CBC with Differential/Platelet - Hemoglobin A1c - Hepatic function panel - Lipid panel - TSH - PSA - alfuzosin (UROXATRAL) 10 MG 24 hr tablet; take 1 tablet by mouth once daily WITH BREAKFAST  Dispense: 90 tablet; Refill: 3  6. Other depression -Restarted on Effexor.  Advised to follow-up in 1 month for possible medication adjustment - venlafaxine XR (EFFEXOR-XR) 37.5 MG 24 hr capsule; take 1 capsule by mouth once daily WITH BREAKFAST  Dispense: 90 capsule; Refill: 1  7. Hyperlipidemia, unspecified hyperlipidemia type -Continue  with statin - Basic metabolic panel - CBC with Differential/Platelet - Hemoglobin A1c - Hepatic function panel - Lipid panel - TSH - PSA - simvastatin (ZOCOR) 20 MG tablet; TAKE 1 TABLET BY MOUTH DAILY AT 6PM  Dispense: 90 tablet; Refill: 3  8. Need for hepatitis C screening test  - Hep C Antibody  9. Needs flu shot  - Flu vaccine HIGH  DOSE PF (Fluzone High dose)  10. Need for 23-polyvalent pneumococcal polysaccharide vaccine  - Pneumococcal polysaccharide vaccine 23-valent greater than or equal to 2yo subcutaneous/IM  11. Medication refill  - alfuzosin (UROXATRAL) 10 MG 24 hr tablet; take 1 tablet by mouth once daily WITH BREAKFAST  Dispense: 90 tablet; Refill: 3 -  amLODipine (NORVASC) 10 MG tablet; Take 1 tablet (10 mg total) by mouth daily.  Dispense: 90 tablet; Refill: 3 - clopidogrel (PLAVIX) 75 MG tablet; Take 1 tablet (75 mg total) by mouth daily.  Dispense: 90 tablet; Refill: 3 - losartan (COZAAR) 50 MG tablet; Take 1 tablet (50 mg total) by mouth daily.  Dispense: 90 tablet; Refill: 3 - simvastatin (ZOCOR) 20 MG tablet; TAKE 1 TABLET BY MOUTH DAILY AT 6PM  Dispense: 90 tablet; Refill: 3 - venlafaxine XR (EFFEXOR-XR) 37.5 MG 24 hr capsule; take 1 capsule by mouth once daily WITH BREAKFAST  Dispense: 90 capsule; Refill: 1  Dorothyann Peng, NP

## 2017-09-12 LAB — CBC WITH DIFFERENTIAL/PLATELET
BASOS ABS: 0.1 10*3/uL (ref 0.0–0.1)
Basophils Relative: 1 % (ref 0.0–3.0)
EOS ABS: 0.1 10*3/uL (ref 0.0–0.7)
Eosinophils Relative: 1.6 % (ref 0.0–5.0)
HCT: 48.3 % (ref 39.0–52.0)
HEMOGLOBIN: 15.7 g/dL (ref 13.0–17.0)
Lymphocytes Relative: 38.3 % (ref 12.0–46.0)
Lymphs Abs: 2.2 10*3/uL (ref 0.7–4.0)
MCHC: 32.4 g/dL (ref 30.0–36.0)
MCV: 83.8 fl (ref 78.0–100.0)
MONO ABS: 0.5 10*3/uL (ref 0.1–1.0)
Monocytes Relative: 8.9 % (ref 3.0–12.0)
NEUTROS PCT: 50.2 % (ref 43.0–77.0)
Neutro Abs: 2.9 10*3/uL (ref 1.4–7.7)
Platelets: 242 10*3/uL (ref 150.0–400.0)
RBC: 5.76 Mil/uL (ref 4.22–5.81)
RDW: 15.5 % (ref 11.5–15.5)
WBC: 5.7 10*3/uL (ref 4.0–10.5)

## 2017-09-12 LAB — HEPATITIS C ANTIBODY
HEP C AB: NONREACTIVE
SIGNAL TO CUT-OFF: 0.03 (ref <1.00)

## 2017-09-12 LAB — HEPATIC FUNCTION PANEL
ALT: 11 U/L (ref 0–53)
AST: 15 U/L (ref 0–37)
Albumin: 4.2 g/dL (ref 3.5–5.2)
Alkaline Phosphatase: 72 U/L (ref 39–117)
BILIRUBIN TOTAL: 0.6 mg/dL (ref 0.2–1.2)
Bilirubin, Direct: 0.1 mg/dL (ref 0.0–0.3)
Total Protein: 7.4 g/dL (ref 6.0–8.3)

## 2017-09-12 LAB — BASIC METABOLIC PANEL
BUN: 10 mg/dL (ref 6–23)
CALCIUM: 9.3 mg/dL (ref 8.4–10.5)
CO2: 28 mEq/L (ref 19–32)
Chloride: 105 mEq/L (ref 96–112)
Creatinine, Ser: 1.09 mg/dL (ref 0.40–1.50)
GFR: 85.44 mL/min (ref >60.00)
Glucose, Bld: 98 mg/dL (ref 70–99)
POTASSIUM: 3.9 meq/L (ref 3.5–5.1)
Sodium: 140 mEq/L (ref 135–145)

## 2017-09-12 LAB — PSA: PSA: 18.26 ng/mL — ABNORMAL HIGH (ref 0.10–4.00)

## 2017-09-12 LAB — LIPID PANEL
Cholesterol: 149 mg/dL (ref 0–200)
HDL: 44.3 mg/dL (ref >39.00)
LDL CALC: 89 mg/dL (ref 0–99)
NONHDL: 104.59
Total CHOL/HDL Ratio: 3
Triglycerides: 79 mg/dL (ref 0.0–149.0)
VLDL: 15.8 mg/dL (ref 0.0–40.0)

## 2017-09-12 LAB — TSH: TSH: 1.14 u[IU]/mL (ref 0.35–4.50)

## 2017-09-12 LAB — HEMOGLOBIN A1C: HEMOGLOBIN A1C: 6.5 % (ref 4.6–6.5)

## 2017-10-01 ENCOUNTER — Other Ambulatory Visit: Payer: Self-pay | Admitting: Family Medicine

## 2017-10-01 ENCOUNTER — Telehealth: Payer: Self-pay | Admitting: Family Medicine

## 2017-10-01 DIAGNOSIS — R972 Elevated prostate specific antigen [PSA]: Secondary | ICD-10-CM

## 2017-10-01 NOTE — Telephone Encounter (Signed)
Pt's wife notified of test results.  He has not seen urology for elevated PSA.  Order for referral placed in [place].  Mrs. Haq also mentioned that the pt has had hallucinations recently.  There were two nights (not consecutive)  where the pt reported a man standing in the doorway.  She wanted to know if the Effexor could cause this?  Please advise.

## 2017-10-01 NOTE — Telephone Encounter (Signed)
Spoke to Mrs. Zangara and informed her that less than 2% of the population have hallucinations while taking Effexor.  Informed her that it could be a UTI.  She said the pt has not had any complaints about urination.  She will keep an eye on him and will call back if continues.  She did mention that she thought the pt was dreaming.

## 2017-10-01 NOTE — Telephone Encounter (Signed)
It can in less than 2% of people who take it.   I would test him for a UTI

## 2018-02-12 ENCOUNTER — Encounter: Payer: Self-pay | Admitting: Adult Health

## 2018-02-12 ENCOUNTER — Ambulatory Visit (INDEPENDENT_AMBULATORY_CARE_PROVIDER_SITE_OTHER): Payer: Medicare Other | Admitting: Adult Health

## 2018-02-12 VITALS — BP 118/80 | Temp 98.2°F | Wt 192.0 lb

## 2018-02-12 DIAGNOSIS — R972 Elevated prostate specific antigen [PSA]: Secondary | ICD-10-CM

## 2018-02-12 DIAGNOSIS — E119 Type 2 diabetes mellitus without complications: Secondary | ICD-10-CM

## 2018-02-12 DIAGNOSIS — I639 Cerebral infarction, unspecified: Secondary | ICD-10-CM

## 2018-02-12 DIAGNOSIS — R4189 Other symptoms and signs involving cognitive functions and awareness: Secondary | ICD-10-CM | POA: Diagnosis not present

## 2018-02-12 LAB — BASIC METABOLIC PANEL
BUN: 11 mg/dL (ref 6–23)
CALCIUM: 9.2 mg/dL (ref 8.4–10.5)
CO2: 27 mEq/L (ref 19–32)
Chloride: 107 mEq/L (ref 96–112)
Creatinine, Ser: 1.15 mg/dL (ref 0.40–1.50)
GFR: 80.22 mL/min (ref >60.00)
GLUCOSE: 102 mg/dL — AB (ref 70–99)
Potassium: 4.3 mEq/L (ref 3.5–5.1)
Sodium: 141 mEq/L (ref 135–145)

## 2018-02-12 LAB — CBC WITH DIFFERENTIAL/PLATELET
BASOS ABS: 0 10*3/uL (ref 0.0–0.1)
Basophils Relative: 0.5 % (ref 0.0–3.0)
EOS ABS: 0.1 10*3/uL (ref 0.0–0.7)
Eosinophils Relative: 1 % (ref 0.0–5.0)
HCT: 45.7 % (ref 39.0–52.0)
HEMOGLOBIN: 15.3 g/dL (ref 13.0–17.0)
LYMPHS ABS: 1.8 10*3/uL (ref 0.7–4.0)
Lymphocytes Relative: 35.4 % (ref 12.0–46.0)
MCHC: 33.4 g/dL (ref 30.0–36.0)
MCV: 83.3 fl (ref 78.0–100.0)
Monocytes Absolute: 0.5 10*3/uL (ref 0.1–1.0)
Monocytes Relative: 9.3 % (ref 3.0–12.0)
NEUTROS PCT: 53.8 % (ref 43.0–77.0)
Neutro Abs: 2.8 10*3/uL (ref 1.4–7.7)
Platelets: 232 10*3/uL (ref 150.0–400.0)
RBC: 5.48 Mil/uL (ref 4.22–5.81)
RDW: 15.2 % (ref 11.5–15.5)
WBC: 5.2 10*3/uL (ref 4.0–10.5)

## 2018-02-12 LAB — POCT URINALYSIS DIPSTICK
Bilirubin, UA: NEGATIVE
Glucose, UA: NEGATIVE
Ketones, UA: NEGATIVE
LEUKOCYTES UA: NEGATIVE
NITRITE UA: NEGATIVE
PROTEIN UA: POSITIVE — AB
RBC UA: POSITIVE
Urobilinogen, UA: 1 E.U./dL
pH, UA: 6 (ref 5.0–8.0)

## 2018-02-12 LAB — HEMOGLOBIN A1C: HEMOGLOBIN A1C: 6.4 % (ref 4.6–6.5)

## 2018-02-12 NOTE — Progress Notes (Signed)
Subjective:    Patient ID: Sean Myers, male    DOB: 1946-08-05, 72 y.o.   MRN: [id number]  HPI  72 year old male who  has a past medical history of Depression, Diabetes mellitus, type II ([place]), ED (erectile dysfunction), History of cerebrovascular accident, Hyperlipidemia, and Stroke ([place]). He presents to the office today for change in cognitive status.  His wife reports that he no longer wants to shower, and prior today he had not showered for two weeks. She also reports that over the last two or three months he has started getting moe agitated at home and is accusing his wife of taking his belongings. Additionally, she feels as though his short term memory has been declining and that he no longer enjoys doing things that he once enjoyed.   He began having cognitive impairment issues after his first stroke in 2006. There was another sharp decline after his second stroke in 2014. Since that time he has had issues with short term memory loss. He often repeats question and forgets conversations quickly. He has trouble remembering where he placed objects such as his wallet.   His wife has not noticed any symptoms of stroke such as facial droop. Slurred speech, or unsteady gait.   Review of Systems See HPI   Past Medical History:  Diagnosis Date  . Depression   . Diabetes mellitus, type II ([place])   . ED (erectile dysfunction)   . History of cerebrovascular accident   . Hyperlipidemia   . Stroke [facility])     Social History   Socioeconomic History  . Marital status: Married    Spouse name: Not on file  . Number of children: Not on file  . Years of education: Not on file  . Highest education level: Not on file  Occupational History  . Not on file  Social Needs  . Financial resource strain: Not on file  . Food insecurity:    Worry: Not on file    Inability: Not on file  . Transportation needs:    Medical: Not on file    Non-medical: Not on file  Tobacco Use  . Smoking status:  Former Smoker    Packs/day: 1.00    Last attempt to quit: 02/15/2013    Years since quitting: 4.9  . Smokeless tobacco: Never Used  Substance and Sexual Activity  . Alcohol use: No  . Drug use: Yes    Types: Marijuana    Comment: marijuana  . Sexual activity: Yes    Partners: Female  Lifestyle  . Physical activity:    Days per week: Not on file    Minutes per session: Not on file  . Stress: Not on file  Relationships  . Social connections:    Talks on phone: Not on file    Gets together: Not on file    Attends religious service: Not on file    Active member of club or organization: Not on file    Attends meetings of clubs or organizations: Not on file    Relationship status: Not on file  . Intimate partner violence:    Fear of current or ex partner: Not on file    Emotionally abused: Not on file    Physically abused: Not on file    Forced sexual activity: Not on file  Other Topics Concern  . Not on file  Social History Narrative   Retired   Married   Current Smoker    Alcohol use-no  son - [name] living in [place] ([place]) -  engaged to be married    Past Surgical History:  Procedure Laterality Date  . COLONOSCOPY  1990's   [facility]  . TEE WITHOUT CARDIOVERSION N/A 01/01/2013   Procedure: TRANSESOPHAGEAL ECHOCARDIOGRAM (TEE);  Surgeon: Larey Dresser, MD;  Location: [facility] ENDOSCOPY;  Service: Cardiovascular;  Laterality: N/A;    Family History  Problem Relation Age of Onset  . Heart disease Mother   . Other Unknown        no family history of colon cancer    Allergies  Allergen Reactions  . Poliovirus Vaccine Inactivated     Heart races, cold sweats, "almost killed me".  . Aspirin     Gi upset  . Penicillins Swelling, Palpitations and Rash    Current Outpatient Medications on File Prior to Visit  Medication Sig Dispense Refill  . alfuzosin (UROXATRAL) 10 MG 24 hr tablet take 1 tablet by mouth once daily WITH BREAKFAST 90 tablet 3  .  amLODipine (NORVASC) 10 MG tablet Take 1 tablet (10 mg total) by mouth daily. 90 tablet 3  . clopidogrel (PLAVIX) 75 MG tablet Take 1 tablet (75 mg total) by mouth daily. 90 tablet 3  . losartan (COZAAR) 50 MG tablet Take 1 tablet (50 mg total) by mouth daily. 90 tablet 3  . simvastatin (ZOCOR) 20 MG tablet TAKE 1 TABLET BY MOUTH DAILY AT 6PM 90 tablet 3  . venlafaxine XR (EFFEXOR-XR) 37.5 MG 24 hr capsule take 1 capsule by mouth once daily WITH BREAKFAST 90 capsule 1   No current facility-administered medications on file prior to visit.     BP 118/80   Temp 98.2 F (36.8 C) (Oral)   Wt 192 lb (87.1 kg)   BMI 25.33 kg/m       Objective:   Physical Exam  Constitutional: He is oriented to person, place, and time. He appears well-developed and well-nourished. No distress.  Eyes: Pupils are equal, round, and reactive to light. Conjunctivae are normal.  Cardiovascular: Normal rate, regular rhythm, normal heart sounds and intact distal pulses. Exam reveals no gallop and no friction rub.  No murmur heard. Pulmonary/Chest: Effort normal and breath sounds normal. No stridor. No respiratory distress. He has no wheezes. He has no rales. He exhibits no tenderness.  Neurological: He is alert and oriented to person, place, and time. He displays normal reflexes. No cranial nerve deficit or sensory deficit. He exhibits normal muscle tone. Coordination normal.  Skin: Skin is warm and dry. Capillary refill takes less than 2 seconds. He is not diaphoretic.  Psychiatric: He has a normal mood and affect. His behavior is normal. Judgment and thought content normal.  Nursing note and vitals reviewed.     Assessment & Plan:  1. Cognitive impairment - Will check labs, MRI and send to neuro psych  - MMSE - 25/30 - MR Brain W Wo Contrast; Future - Basic Metabolic Panel - CBC with Differential/Platelet - Hemoglobin A1c - POC Urinalysis Dipstick - Ambulatory referral to Neurology  2. Elevated PSA -  referral was placed in January for Urology consult due to PSA of 18. Reports never heard anything  - Ambulatory referral to Urology  3. Diabetes mellitus without complication ([place])  - Basic Metabolic Panel - CBC with Differential/Platelet - Hemoglobin A1c

## 2018-02-13 ENCOUNTER — Encounter: Payer: Self-pay | Admitting: Psychology

## 2018-03-06 ENCOUNTER — Other Ambulatory Visit: Payer: Self-pay | Admitting: Adult Health

## 2018-03-06 DIAGNOSIS — Z760 Encounter for issue of repeat prescription: Secondary | ICD-10-CM

## 2018-03-06 DIAGNOSIS — F3289 Other specified depressive episodes: Secondary | ICD-10-CM

## 2018-03-09 ENCOUNTER — Ambulatory Visit: Payer: Medicare Other | Admitting: Psychology

## 2018-03-09 ENCOUNTER — Encounter: Payer: Self-pay | Admitting: Psychology

## 2018-03-09 ENCOUNTER — Ambulatory Visit (INDEPENDENT_AMBULATORY_CARE_PROVIDER_SITE_OTHER): Payer: Medicare Other | Admitting: Psychology

## 2018-03-09 DIAGNOSIS — F0391 Unspecified dementia with behavioral disturbance: Secondary | ICD-10-CM | POA: Diagnosis not present

## 2018-03-09 DIAGNOSIS — F0150 Vascular dementia without behavioral disturbance: Secondary | ICD-10-CM
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
## 2018-03-09 NOTE — Progress Notes (Signed)
   Neuropsychology Note  GAYLIN BULTHUIS completed 60 minutes of neuropsychological testing with technician, Milana Kidney, BS, under the supervision of Dr. Macarthur Critchley, Licensed Psychologist. The patient did not appear overtly distressed by the testing session, per behavioral observation or via self-report to the technician. Rest breaks were offered.   Clinical Decision Making: In considering the patient's current level of functioning, level of presumed impairment, nature of symptoms, emotional and behavioral responses during the interview, level of literacy, and observed level of motivation/effort, a battery of tests was selected and communicated to the psychometrician.  Communication between the psychologist and technician was ongoing throughout the testing session and changes were made as deemed necessary based on patient performance on testing, technician observations and additional pertinent factors such as those listed above.  Sean Myers will return within approximately 2 weeks for an interactive feedback session with Dr. Si Raider at which time his test performances, clinical impressions and treatment recommendations will be reviewed in detail. The patient understands he can contact our office should he require our assistance before this time.  35 minutes spent performing neuropsychological evaluation services/clinical decision making (psychologist). [CPT 30160] 60 minutes spent face-to-face with patient administering standardized tests, 30 minutes spent scoring (technician). [CPT [id number], 10932]  Full report to follow.

## 2018-03-10 ENCOUNTER — Ambulatory Visit
Admission: RE | Admit: 2018-03-10 | Discharge: 2018-03-10 | Disposition: A | Discharge status: Home / Self Care | Payer: Medicare Other | Source: Ambulatory Visit | Attending: Adult Health | Admitting: Adult Health

## 2018-03-10 ENCOUNTER — Encounter: Payer: Self-pay | Admitting: Psychology

## 2018-03-10 DIAGNOSIS — R413 Other amnesia: Secondary | ICD-10-CM | POA: Diagnosis not present

## 2018-03-10 DIAGNOSIS — R4189 Other symptoms and signs involving cognitive functions and awareness: Secondary | ICD-10-CM

## 2018-03-10 MED ORDER — GADOBENATE DIMEGLUMINE 529 MG/ML IV SOLN
17.0000 mL | Freq: Once | INTRAVENOUS | Status: AC | PRN
  Administered 2018-03-10: 17 mL via INTRAVENOUS

## 2018-03-10 NOTE — Telephone Encounter (Signed)
Sent to the pharmacy by e-scribe. 

## 2018-03-10 NOTE — Telephone Encounter (Signed)
Ok to refill for 6 months 

## 2018-03-10 NOTE — Telephone Encounter (Signed)
Pt seen on 09/11/17 and medication restarted.  You instructed pt to follow up in 1 month.  Pt was seen on  02/12/18 for cognitive impairment.  Please advise.

## 2018-03-11 ENCOUNTER — Telehealth: Payer: Self-pay | Admitting: Adult Health

## 2018-03-11 NOTE — Telephone Encounter (Signed)
Updated spouse on MRI Brain results. They have a follow up appointment coming up with Dr. Richrd Sox, will await recommendations
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
## 2018-03-30 ENCOUNTER — Ambulatory Visit (INDEPENDENT_AMBULATORY_CARE_PROVIDER_SITE_OTHER): Payer: Medicare Other | Admitting: Psychology

## 2018-03-30 DIAGNOSIS — F0150 Vascular dementia without behavioral disturbance: Secondary | ICD-10-CM | POA: Diagnosis not present

## 2018-03-31 ENCOUNTER — Encounter: Payer: Self-pay | Admitting: Psychology

## 2018-04-01 ENCOUNTER — Ambulatory Visit: Payer: Medicare Other | Admitting: Adult Health

## 2018-04-16 ENCOUNTER — Encounter: Payer: Self-pay | Admitting: Adult Health

## 2018-04-16 ENCOUNTER — Ambulatory Visit (INDEPENDENT_AMBULATORY_CARE_PROVIDER_SITE_OTHER): Payer: Medicare Other | Admitting: Adult Health

## 2018-04-16 VITALS — BP 128/70 | HR 64 | Temp 98.2°F | Wt 189.5 lb

## 2018-04-16 DIAGNOSIS — Z8673 Personal history of transient ischemic attack (TIA), and cerebral infarction without residual deficits: Secondary | ICD-10-CM

## 2018-04-16 DIAGNOSIS — F0150 Vascular dementia without behavioral disturbance: Secondary | ICD-10-CM | POA: Diagnosis not present

## 2018-04-16 MED ORDER — MEMANTINE HCL 28 X 5 MG & 21 X 10 MG PO TABS: ORAL_TABLET | ORAL | 12 refills | Status: DC

## 2018-04-16 NOTE — Progress Notes (Signed)
Subjective:    Patient ID: Sean Myers, male    DOB: Jul 03, 1946, 72 y.o.   MRN: [id number]  HPI  72 year old male who  has a past medical history of Depression, Diabetes mellitus, type II ([place]), ED (erectile dysfunction), History of cerebrovascular accident, Hyperlipidemia, and Stroke ([place]).  Presents to the office today with his wife for follow-up after being seen by neuropsychiatry.  Neuropsychiatry recommended patient starting Namenda for vascular dementia. Review of Systems See HPI   Past Medical History:  Diagnosis Date  . Depression   . Diabetes mellitus, type II ([place])   . ED (erectile dysfunction)   . History of cerebrovascular accident   . Hyperlipidemia   . Stroke [facility])     Social History   Socioeconomic History  . Marital status: Married    Spouse name: Not on file  . Number of children: Not on file  . Years of education: Not on file  . Highest education level: Not on file  Occupational History  . Not on file  Social Needs  . Financial resource strain: Not on file  . Food insecurity:    Worry: Not on file    Inability: Not on file  . Transportation needs:    Medical: Not on file    Non-medical: Not on file  Tobacco Use  . Smoking status: Former Smoker    Packs/day: 1.00    Last attempt to quit: 02/15/2013    Years since quitting: 5.1  . Smokeless tobacco: Never Used  Substance and Sexual Activity  . Alcohol use: No  . Drug use: Yes    Types: Marijuana    Comment: marijuana  . Sexual activity: Yes    Partners: Female  Lifestyle  . Physical activity:    Days per week: Not on file    Minutes per session: Not on file  . Stress: Not on file  Relationships  . Social connections:    Talks on phone: Not on file    Gets together: Not on file    Attends religious service: Not on file    Active member of club or organization: Not on file    Attends meetings of clubs or organizations: Not on file    Relationship status: Not on file  . Intimate  partner violence:    Fear of current or ex partner: Not on file    Emotionally abused: Not on file    Physically abused: Not on file    Forced sexual activity: Not on file  Other Topics Concern  . Not on file  Social History Narrative   Retired   Married   Current Smoker    Alcohol use-no         son [name] living in [place] ([place]) -  engaged to be married    Past Surgical History:  Procedure Laterality Date  . COLONOSCOPY  1990's   [facility]  . TEE WITHOUT CARDIOVERSION N/A 01/01/2013   Procedure: TRANSESOPHAGEAL ECHOCARDIOGRAM (TEE);  Surgeon: Larey Dresser, MD;  Location: [facility] ENDOSCOPY;  Service: Cardiovascular;  Laterality: N/A;    Family History  Problem Relation Age of Onset  . Heart disease Mother   . Other Unknown        no family history of colon cancer    Allergies  Allergen Reactions  . Poliovirus Vaccine Inactivated     Heart races, cold sweats, "almost killed me".  . Aspirin     Gi upset  . Penicillins  Swelling, Palpitations and Rash    Current Outpatient Medications on File Prior to Visit  Medication Sig Dispense Refill  . alfuzosin (UROXATRAL) 10 MG 24 hr tablet take 1 tablet by mouth once daily WITH BREAKFAST 90 tablet 3  . amLODipine (NORVASC) 10 MG tablet Take 1 tablet (10 mg total) by mouth daily. 90 tablet 3  . clopidogrel (PLAVIX) 75 MG tablet Take 1 tablet (75 mg total) by mouth daily. 90 tablet 3  . losartan (COZAAR) 50 MG tablet Take 1 tablet (50 mg total) by mouth daily. 90 tablet 3  . simvastatin (ZOCOR) 20 MG tablet TAKE 1 TABLET BY MOUTH DAILY AT 6PM 90 tablet 3  . venlafaxine XR (EFFEXOR-XR) 37.5 MG 24 hr capsule TAKE 1 CAPSULE BY MOUTH ONCE DAILY WITH BREAKFAST 90 capsule 1   No current facility-administered medications on file prior to visit.     BP 128/70 (BP Location: Right Arm, Patient Position: Sitting, Cuff Size: Normal)   Pulse 64   Temp 98.2 F (36.8 C) (Oral)   Wt 189 lb 8 oz (86 kg)   SpO2 97%   BMI 25.00 kg/m        Objective:   Physical Exam  Constitutional: He appears well-developed and well-nourished. No distress.  Cardiovascular: Normal rate, regular rhythm, normal heart sounds and intact distal pulses.  Pulmonary/Chest: Effort normal and breath sounds normal.  Neurological: He is alert.  His wife helps him answer questions that he does not know the answer to  Skin: Skin is warm and dry. He is not diaphoretic.  Psychiatric: He has a normal mood and affect. His behavior is normal. Judgment and thought content normal.  Nursing note and vitals reviewed.     Assessment & Plan:  1. Vascular dementia without behavioral disturbance -We discussed risks and benefits as well as side effects in detail.  Both patient and wife agree that they would like to try this medication.  We will start them on Namenda titration pack with follow-up in 1 month or sooner if side effects develop - memantine (NAMENDA TITRATION PAK) tablet pack; 5 mg/day for =1 week; 5 mg twice daily for =1 week; 15 mg/day given in 5 mg and 10 mg separated doses for =1 week; then 10 mg twice daily  Dispense: 49 tablet; Refill: [place], NP

## 2018-05-08 ENCOUNTER — Other Ambulatory Visit: Payer: Self-pay | Admitting: Adult Health

## 2018-05-08 ENCOUNTER — Telehealth: Payer: Self-pay | Admitting: Adult Health

## 2018-05-08 MED ORDER — MEMANTINE HCL 5 MG PO TABS
5.0000 mg | ORAL_TABLET | Freq: Every day | ORAL | 0 refills | Status: DC
Start: 2018-05-08 — End: 2019-10-21

## 2018-05-08 NOTE — Telephone Encounter (Signed)
Pt's wife notified that a new rx has been sent in.  No further action required.

## 2018-05-08 NOTE — Telephone Encounter (Signed)
Copied from [place] [id number]. Topic: General - Other >> May 08, 2018  9:17 AM Yvette Rack wrote: Reason for CRM: pt wife calling stating that the memantine [facility] TITRATION PAK) tablet pack cost over $600 and would like something else instead

## 2018-05-08 NOTE — Telephone Encounter (Signed)
Namenda 5 mg tabs sent in

## 2018-05-19 ENCOUNTER — Ambulatory Visit: Payer: Medicare Other | Admitting: Adult Health

## 2018-11-05 IMAGING — MR MR HEAD WO/W CM
13 series · 48 of 48 positions shown · IV contrast (17ml multihance)
Comparison: [HOSPITAL] brain MRI and intracranial MRA
12/30/2012.

CLINICAL DATA: 71-year-old male with cognitive impairment. Memory
loss, mild confusion, unsteady gait. History of prior brain
infarcts.

EXAM:
MRI HEAD WITHOUT AND WITH CONTRAST
TECHNIQUE: Multiplanar, multiecho pulse sequences of the brain and surrounding
structures were obtained without and with intravenous contrast.
CONTRAST:  17mL MULTIHANCE GADOBENATE DIMEGLUMINE 529 MG/ML IV SOLN

[Series 2: T1 · sagittal · 5.0mm · 0.45mm/px · 2 of 23 slices shown]
[im 1/23]
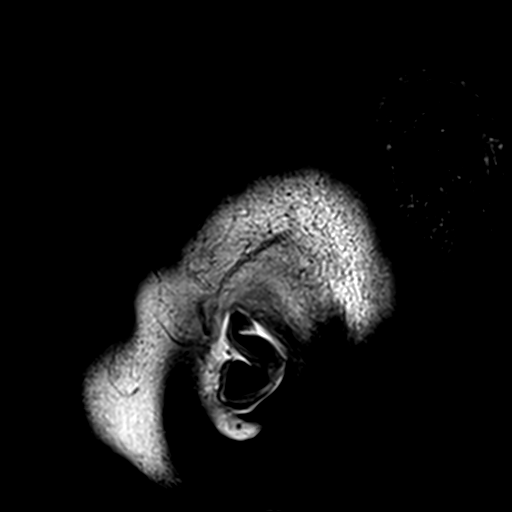
[im 23/23]
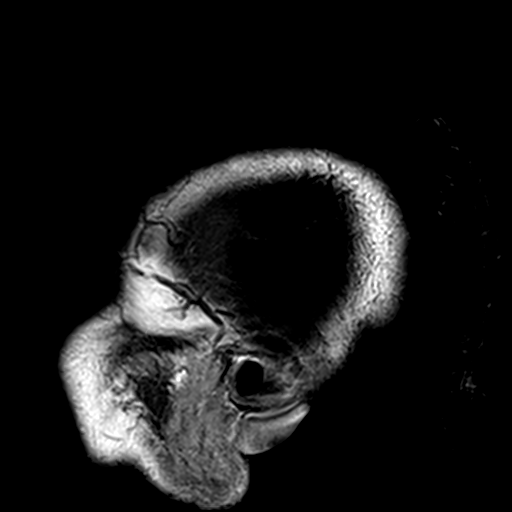

[Series 3: DWI · axial · 3.0mm · 1.80mm/px · z∈[-88,+58]mm · 6 of 99 slices shown (1 of 4)]
[im 1/99]
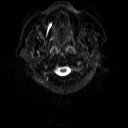
[im 20/99]
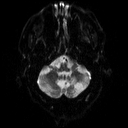
[im 40/99]
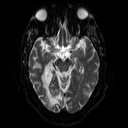
[im 59/99]
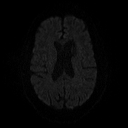
[im 79/99]
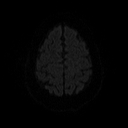
[im 99/99]
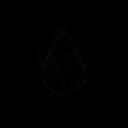

[Series 4: DWI · axial · 3.0mm · 1.80mm/px · z∈[-88,+58]mm · 3 of 50 slices shown (2 of 4)]
[im 1/50]
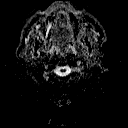
[im 25/50]
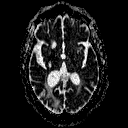
[im 50/50]
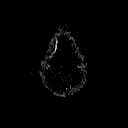

[Series 5: DWI · coronal · 5.0mm · 1.80mm/px · 5 of 80 slices shown (3 of 4)]
[im 1/80]
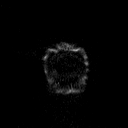
[im 20/80]
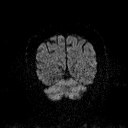
[im 40/80]
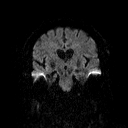
[im 60/80]
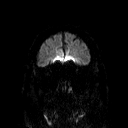
[im 80/80]
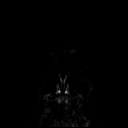

[Series 6: DWI · coronal · 5.0mm · 1.80mm/px · 3 of 40 slices shown (4 of 4)]
[im 1/40]
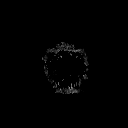
[im 20/40]
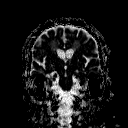
[im 40/40]
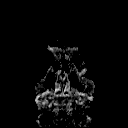

[Series 7: T2 · axial · 5.0mm · 0.51mm/px · 1 of 22 slices shown (1 of 2)]
[im 1/22]
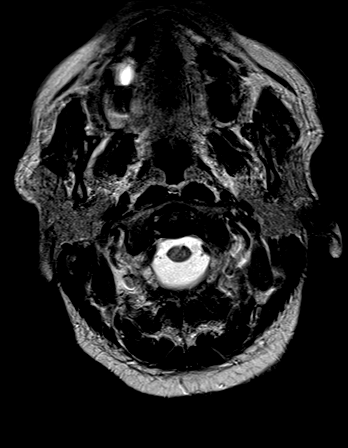

[Series 8: FLAIR · axial · 3.0mm · 0.45mm/px · z∈[-88,+64]mm · 2 of 34 slices shown (1 of 2)]
[im 1/34]
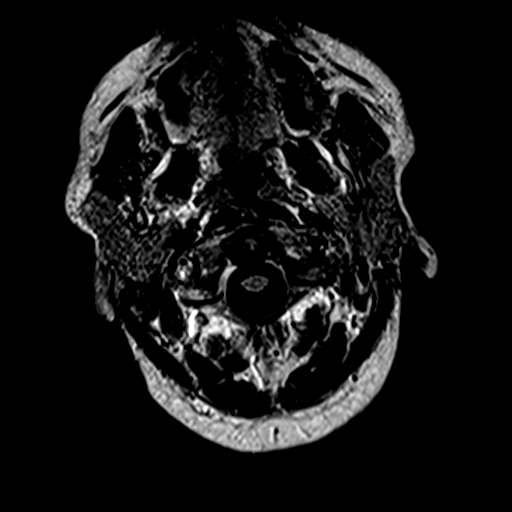
[im 34/34]
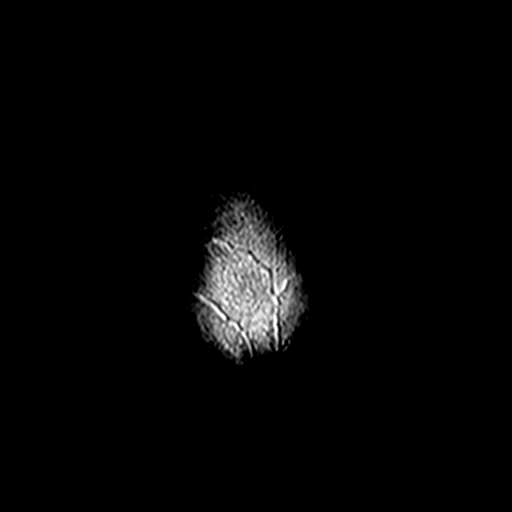

[Series 10: swi_images · axial · 5.0mm · 0.90mm/px · z∈[-102,+53]mm · 2 of 32 slices shown]
[im 1/32]
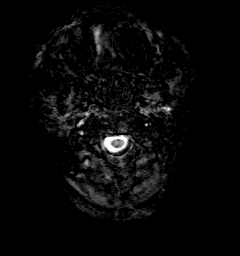
[im 32/32]
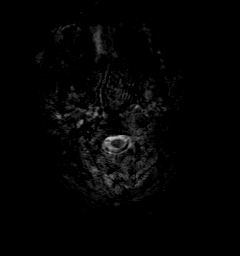

[Series 11: t1_mpr_tra · axial · 1.0mm · 0.75mm/px · z∈[-99,+49]mm · 9 of 144 slices shown (1 of 2)]
[im 1/144]
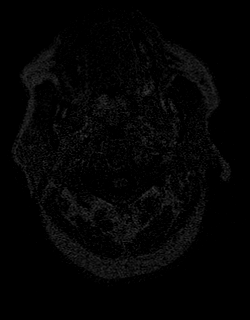
[im 18/144]
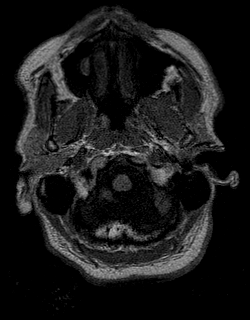
[im 36/144]
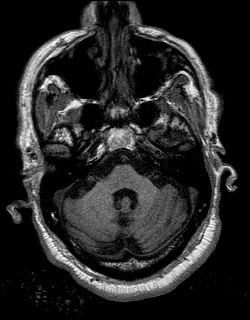
[im 54/144]
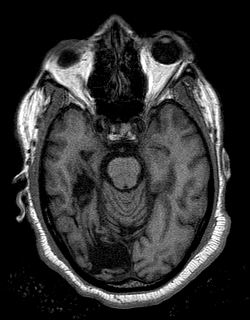
[im 72/144]
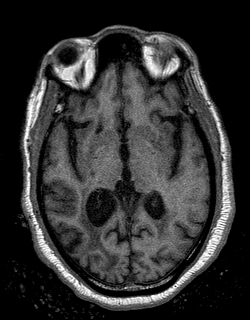
[im 90/144]
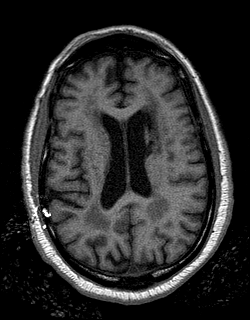
[im 108/144]
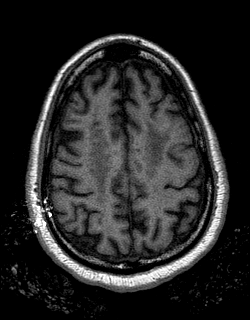
[im 126/144]
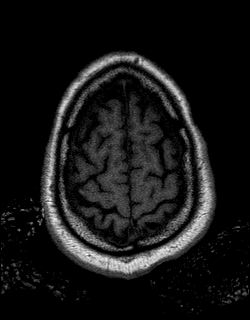
[im 144/144]
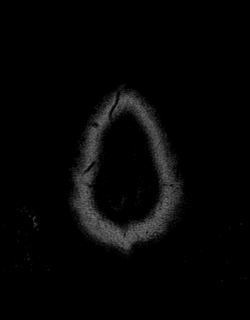

[Series 12: FLAIR · axial · 3.0mm · 0.45mm/px · z∈[-103,+50]mm · 2 of 34 slices shown (2 of 2)]
[im 1/34]
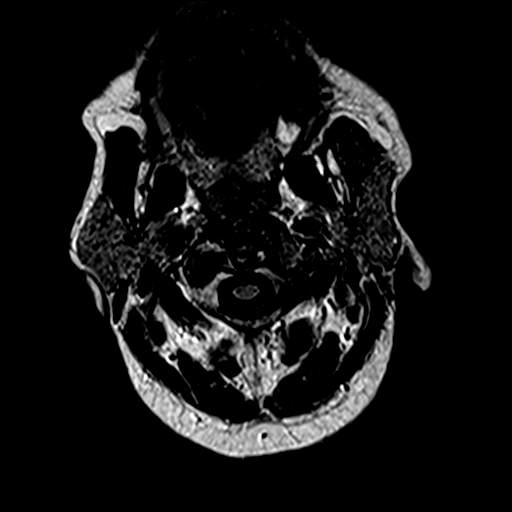
[im 34/34]
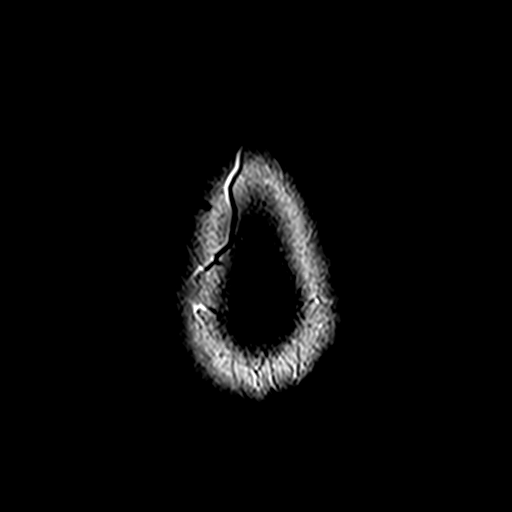

[Series 13: T2 · coronal · 5.0mm · 0.45mm/px · 2 of 30 slices shown (2 of 2)]
[im 1/30]
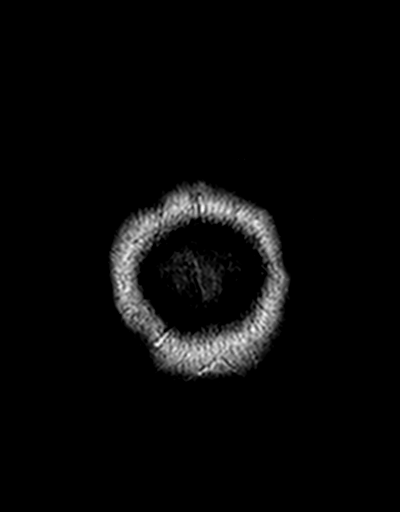
[im 30/30]
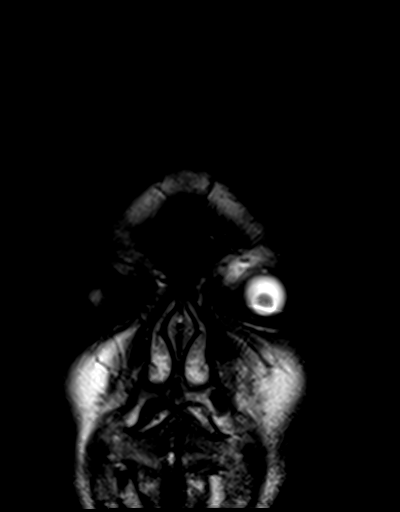

[Series 14: t1_mpr_tra · axial · 1.0mm · 0.75mm/px · z∈[-99,+49]mm · 9 of 144 slices shown (2 of 2)]
[im 1/144]
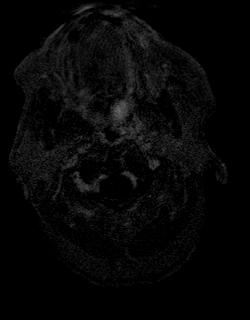
[im 18/144]
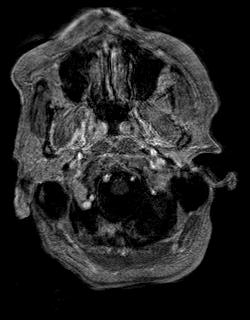
[im 36/144]
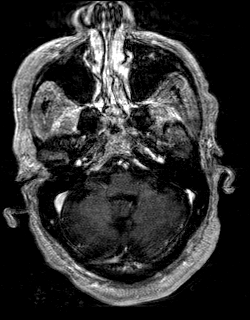
[im 54/144]
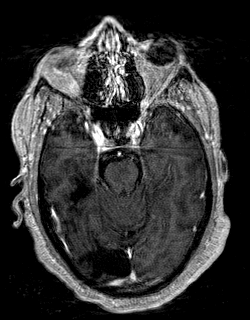
[im 72/144]
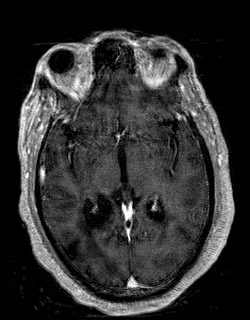
[im 90/144]
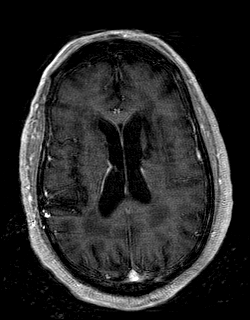
[im 108/144]
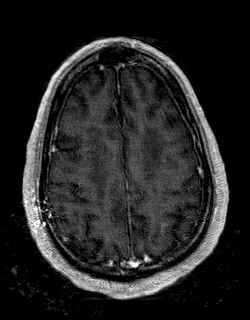
[im 126/144]
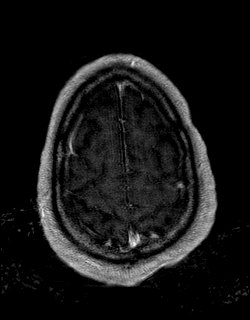
[im 144/144]
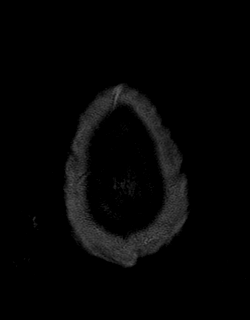

[Series 15: post cor · coronal · 5.0mm · 0.45mm/px · 2 of 30 slices shown]
[im 1/30]
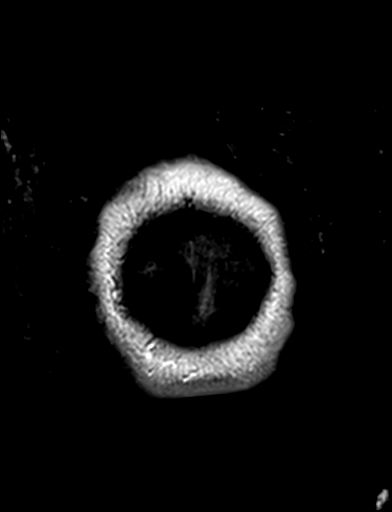
[im 30/30]
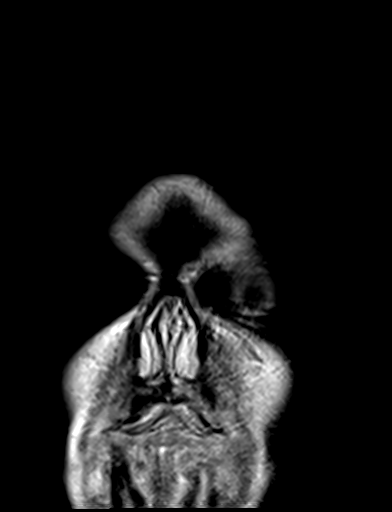

[48 of 48 positions shown; findings below may reference images not displayed]

FINDINGS: Brain: No restricted diffusion or evidence of acute infarction.
Chronic infarcts in the bilateral PICA territories (worse on the
left), which were acute on the 0930 comparison.

Additional chronic ischemia in the right PCA territory, heavily
involving the bilateral deep gray matter nuclei, and in the left MCA
white matter. Chronic hemosiderin associated with some of these.
Chronic right mesial temporal lobe encephalomalacia is associated
with the previous right PCA territory infarct and stable since 0930.
Mild superficial siderosis also along the right sensory strip.

Superimposed widespread bilateral cerebral white matter and also
moderate patchy pontine T2 and FLAIR hyperintensity which likely
reflects additional chronic small vessel disease.

No abnormal enhancement identified. No midline shift, mass effect,
evidence of mass lesion, ventriculomegaly, extra-axial collection or
acute intracranial hemorrhage. Cervicomedullary junction and
pituitary are within normal limits.

Vascular: Major intracranial vascular flow voids are stable since
0930. The major dural venous sinuses are enhancing and appear
patent.

Skull and upper cervical spine: Negative visible cervical spine.
Normal bone marrow signal.

Sinuses/Orbits: Orbits soft tissues remain normal. Mild paranasal
sinus mucosal thickening is stable.

Other: Mastoid air cells remain clear. Visible internal auditory
structures appear normal. Scalp and face soft tissues appear
negative.
IMPRESSION: No acute intracranial abnormality.

Advanced chronic ischemic disease in the anterior and posterior
circulation ranging with small through large vessel involvement.

## 2019-03-25 ENCOUNTER — Other Ambulatory Visit: Payer: Self-pay

## 2019-06-05 ENCOUNTER — Other Ambulatory Visit: Payer: Self-pay | Admitting: Adult Health

## 2019-06-05 DIAGNOSIS — Z760 Encounter for issue of repeat prescription: Secondary | ICD-10-CM

## 2019-06-05 DIAGNOSIS — F3289 Other specified depressive episodes: Secondary | ICD-10-CM

## 2019-06-08 ENCOUNTER — Encounter: Payer: Self-pay | Admitting: Family Medicine

## 2019-06-08 NOTE — Telephone Encounter (Signed)
Sent for 30 days and letter mailed.

## 2019-07-21 ENCOUNTER — Ambulatory Visit (INDEPENDENT_AMBULATORY_CARE_PROVIDER_SITE_OTHER): Payer: Medicare Other

## 2019-07-21 ENCOUNTER — Other Ambulatory Visit: Payer: Self-pay

## 2019-07-21 ENCOUNTER — Encounter: Payer: Self-pay | Admitting: Adult Health

## 2019-07-21 DIAGNOSIS — Z23 Encounter for immunization: Secondary | ICD-10-CM

## 2019-10-21 ENCOUNTER — Other Ambulatory Visit: Payer: Self-pay

## 2019-10-21 ENCOUNTER — Encounter: Payer: Self-pay | Admitting: Adult Health

## 2019-10-21 ENCOUNTER — Ambulatory Visit (INDEPENDENT_AMBULATORY_CARE_PROVIDER_SITE_OTHER): Payer: Medicare Other | Admitting: Adult Health

## 2019-10-21 VITALS — BP 160/104 | Temp 97.6°F | Wt 191.0 lb

## 2019-10-21 DIAGNOSIS — I639 Cerebral infarction, unspecified: Secondary | ICD-10-CM | POA: Diagnosis not present

## 2019-10-21 DIAGNOSIS — Z760 Encounter for issue of repeat prescription: Secondary | ICD-10-CM

## 2019-10-21 DIAGNOSIS — E785 Hyperlipidemia, unspecified: Secondary | ICD-10-CM | POA: Diagnosis not present

## 2019-10-21 DIAGNOSIS — R972 Elevated prostate specific antigen [PSA]: Secondary | ICD-10-CM

## 2019-10-21 DIAGNOSIS — F3289 Other specified depressive episodes: Secondary | ICD-10-CM

## 2019-10-21 DIAGNOSIS — F0150 Vascular dementia without behavioral disturbance: Secondary | ICD-10-CM

## 2019-10-21 DIAGNOSIS — E119 Type 2 diabetes mellitus without complications: Secondary | ICD-10-CM | POA: Diagnosis not present

## 2019-10-21 DIAGNOSIS — I10 Essential (primary) hypertension: Secondary | ICD-10-CM

## 2019-10-21 DIAGNOSIS — N400 Enlarged prostate without lower urinary tract symptoms: Secondary | ICD-10-CM

## 2019-10-21 LAB — CBC WITH DIFFERENTIAL/PLATELET
Basophils Absolute: 0 10*3/uL (ref 0.0–0.1)
Basophils Relative: 0.5 % (ref 0.0–3.0)
Eosinophils Absolute: 0.1 10*3/uL (ref 0.0–0.7)
Eosinophils Relative: 2.1 % (ref 0.0–5.0)
HCT: 47.7 % (ref 39.0–52.0)
Hemoglobin: 15.5 g/dL (ref 13.0–17.0)
Lymphocytes Relative: 37.9 % (ref 12.0–46.0)
Lymphs Abs: 2.6 10*3/uL (ref 0.7–4.0)
MCHC: 32.5 g/dL (ref 30.0–36.0)
MCV: 83.8 fl (ref 78.0–100.0)
Monocytes Absolute: 0.8 10*3/uL (ref 0.1–1.0)
Monocytes Relative: 11.1 % (ref 3.0–12.0)
Neutro Abs: 3.3 10*3/uL (ref 1.4–7.7)
Neutrophils Relative %: 48.4 % (ref 43.0–77.0)
Platelets: 246 10*3/uL (ref 150.0–400.0)
RBC: 5.7 Mil/uL (ref 4.22–5.81)
RDW: 14.9 % (ref 11.5–15.5)
WBC: 6.9 10*3/uL (ref 4.0–10.5)

## 2019-10-21 LAB — COMPREHENSIVE METABOLIC PANEL
ALT: 11 U/L (ref 0–53)
AST: 15 U/L (ref 0–37)
Albumin: 4 g/dL (ref 3.5–5.2)
Alkaline Phosphatase: 75 U/L (ref 39–117)
BUN: 10 mg/dL (ref 6–23)
CO2: 29 mEq/L (ref 19–32)
Calcium: 9.4 mg/dL (ref 8.4–10.5)
Chloride: 105 mEq/L (ref 96–112)
Creatinine, Ser: 1.06 mg/dL (ref 0.40–1.50)
GFR: 82.54 mL/min (ref >60.00)
Glucose, Bld: 91 mg/dL (ref 70–99)
Potassium: 4 mEq/L (ref 3.5–5.1)
Sodium: 141 mEq/L (ref 135–145)
Total Bilirubin: 0.3 mg/dL (ref 0.2–1.2)
Total Protein: 7.6 g/dL (ref 6.0–8.3)

## 2019-10-21 LAB — PSA: PSA: 37.88 ng/mL — ABNORMAL HIGH (ref 0.10–4.00)

## 2019-10-21 LAB — LIPID PANEL
Cholesterol: 177 mg/dL (ref 0–200)
HDL: 40.4 mg/dL (ref >39.00)
LDL Cholesterol: 122 mg/dL — ABNORMAL HIGH (ref 0–99)
NonHDL: 137.05
Total CHOL/HDL Ratio: 4
Triglycerides: 74 mg/dL (ref 0.0–149.0)
VLDL: 14.8 mg/dL (ref 0.0–40.0)

## 2019-10-21 LAB — TSH: TSH: 0.72 u[IU]/mL (ref 0.35–4.50)

## 2019-10-21 MED ORDER — AMLODIPINE BESYLATE 10 MG PO TABS: 10.0000 mg | ORAL_TABLET | Freq: Every day | ORAL | 3 refills | Status: AC

## 2019-10-21 MED ORDER — CLOPIDOGREL BISULFATE 75 MG PO TABS: 75.0000 mg | ORAL_TABLET | Freq: Every day | ORAL | 3 refills | Status: AC

## 2019-10-21 MED ORDER — LOSARTAN POTASSIUM 50 MG PO TABS: 50.0000 mg | ORAL_TABLET | Freq: Every day | ORAL | 3 refills | Status: AC

## 2019-10-21 MED ORDER — SIMVASTATIN 20 MG PO TABS: ORAL_TABLET | ORAL | 3 refills | Status: AC

## 2019-10-21 MED ORDER — MEMANTINE HCL 10 MG PO TABS: 10.0000 mg | ORAL_TABLET | Freq: Two times a day (BID) | ORAL | 3 refills | Status: AC

## 2019-10-21 MED ORDER — MEMANTINE HCL 28 X 5 MG & 21 X 10 MG PO TABS: ORAL_TABLET | ORAL | 0 refills | Status: AC

## 2019-10-21 MED ORDER — ALFUZOSIN HCL ER 10 MG PO TB24: ORAL_TABLET | ORAL | 3 refills | Status: AC

## 2019-10-21 MED ORDER — VENLAFAXINE HCL ER 37.5 MG PO CP24: ORAL_CAPSULE | ORAL | 1 refills | Status: AC
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
## 2019-10-22 ENCOUNTER — Other Ambulatory Visit: Payer: Self-pay | Admitting: Adult Health

## 2019-10-22 DIAGNOSIS — R972 Elevated prostate specific antigen [PSA]: Secondary | ICD-10-CM

## 2019-10-22 LAB — HEMOGLOBIN A1C: Hgb A1c MFr Bld: 6.2 % (ref 4.6–6.5)

## 2020-09-14 NOTE — Progress Notes (Deleted)
Subjective:   Sean Myers is a 75 y.o. male who presents for an Initial Medicare Annual Wellness Visit.  Review of Systems    N/A        Objective:    There were no vitals filed for this visit. There is no height or weight on file to calculate BMI.  Advanced Directives 12/24/2013 12/31/2012 12/29/2012  Does Patient Have a Medical Advance Directive? Patient would not like information Patient does not have advance directive Patient does not have advance directive;Patient would not like information  Pre-existing out of facility DNR order (yellow form or pink MOST form) - - No    Current Medications (verified) Outpatient Encounter Medications as of 09/14/2020  Medication Sig  . alfuzosin (UROXATRAL) 10 MG 24 hr tablet take 1 tablet by mouth once daily WITH BREAKFAST  . amLODipine (NORVASC) 10 MG tablet Take 1 tablet (10 mg total) by mouth daily.  . clopidogrel (PLAVIX) 75 MG tablet Take 1 tablet (75 mg total) by mouth daily.  Marland Kitchen losartan (COZAAR) 50 MG tablet Take 1 tablet (50 mg total) by mouth daily.  . memantine (NAMENDA TITRATION PAK) tablet pack 5 mg/day for =1 week; 5 mg twice daily for =1 week; 15 mg/day given in 5 mg and 10 mg separated doses for =1 week; then 10 mg twice daily  . memantine (NAMENDA) 10 MG tablet Take 1 tablet (10 mg total) by mouth 2 (two) times daily.  . simvastatin (ZOCOR) 20 MG tablet TAKE 1 TABLET BY MOUTH DAILY AT 6PM  . venlafaxine XR (EFFEXOR-XR) 37.5 MG 24 hr capsule Take one tablet twice a day   No facility-administered encounter medications on file as of 09/14/2020.    Allergies (verified) Poliovirus vaccine inactivated, Aspirin, and Penicillins   History: Past Medical History:  Diagnosis Date  . Depression   . Diabetes mellitus, type II ([place])   . ED (erectile dysfunction)   . History of cerebrovascular accident   . Hyperlipidemia   . Stroke [facility])    Past Surgical History:  Procedure Laterality Date  . COLONOSCOPY  1990's   [facility]  . TEE WITHOUT CARDIOVERSION N/A 01/01/2013   Procedure: TRANSESOPHAGEAL ECHOCARDIOGRAM (TEE);  Surgeon: Larey Dresser, MD;  Location: [facility] ENDOSCOPY;  Service: Cardiovascular;  Laterality: N/A;   Family History  Problem Relation Age of Onset  . Heart disease Mother   . Other Other        no family history of colon cancer   Social History   Socioeconomic History  . Marital status: Married    Spouse name: Not on file  . Number of children: Not on file  . Years of education: Not on file  . Highest education level: Not on file  Occupational History  . Not on file  Tobacco Use  . Smoking status: Former Smoker    Packs/day: 1.00    Quit date: 02/15/2013    Years since quitting: 7.5  . Smokeless tobacco: Never Used  Substance and Sexual Activity  . Alcohol use: No  . Drug use: Yes    Types: Marijuana    Comment: marijuana  . Sexual activity: Yes    Partners: Female  Other Topics Concern  . Not on file  Social History Narrative   Retired   Married   Current Smoker    Alcohol use-no         son Sean Myers living in [place] ([place]) -  engaged to be married   Social Determinants  of Health   Financial Resource Strain: Not on file  Food Insecurity: Not on file  Transportation Needs: Not on file  Physical Activity: Not on file  Stress: Not on file  Social Connections: Not on file    Tobacco Counseling Counseling given: Not Answered   Clinical Intake:                 Diabetic?yes  Nutrition Risk Assessment:  Has the patient had any N/V/D within the last 2 months?  {YES/NO:21197} Does the patient have any non-healing wounds?  {YES/NO:21197} Has the patient had any unintentional weight loss or weight gain?  {YES/NO:21197}  Diabetes:  Is the patient diabetic?  {YES/NO:21197} If diabetic, was a CBG obtained today?  {YES/NO:21197} Did the patient bring in their glucometer from home?  {YES/NO:21197} How often do you monitor your CBG's? ***.    Financial Strains and Diabetes Management:  Are you having any financial strains with the device, your supplies or your medication? {YES/NO:21197}.  Does the patient want to be seen by Chronic Care Management for management of their diabetes?  {YES/NO:21197} Would the patient like to be referred to a Nutritionist or for Diabetic Management?  {YES/NO:21197}  Diabetic Exams:  {Diabetic Eye Exam:2101801} {Diabetic Foot Exam:2101802}          Activities of Daily Living No flowsheet data found.  Patient Care Team: Dorothyann Peng, NP as PCP - General (Family Medicine) Shawna Orleans, Doe-Hyun R, DO (Inactive)  Indicate any recent Medical Services you may have received from other than [facility] providers in the past year (date may be approximate).     Assessment:   This is a routine wellness examination for Sean Myers.  Hearing/Vision screen No exam data present  Dietary issues and exercise activities discussed:    Goals   None    Depression Screen PHQ 2/9 Scores 09/11/2017 06/27/2016 05/05/2015 07/02/2013  PHQ - 2 Score 0 0 0 0    Fall Risk Fall Risk  03/25/2019 09/11/2017 06/27/2016 05/05/2015 07/02/2013  Falls in the past year? 1 Yes No No Yes  Comment Emmi Telephone Survey: data to providers prior to load - - - -  Number falls in past yr: 1 2 or more - - 1  Comment Emmi Telephone Survey Actual Response = 94 - - - -  Injury with Fall? 0 No - - No    FALL RISK PREVENTION PERTAINING TO THE HOME:  Any stairs in or around the home? {YES/NO:21197} If so, are there any without handrails? No  Home free of loose throw rugs in walkways, pet beds, electrical cords, etc? Yes  Adequate lighting in your home to reduce risk of falls? Yes   ASSISTIVE DEVICES UTILIZED TO PREVENT FALLS:  Life alert? {YES/NO:21197} Use of a cane, walker or w/c? {YES/NO:21197} Grab bars in the bathroom? {YES/NO:21197} Shower chair or bench in shower? {YES/NO:21197} Elevated toilet seat or a handicapped toilet?  {YES/NO:21197}    Cognitive Function: MMSE - Mini Mental State Exam 02/12/2018  Orientation to time 4  Orientation to Place 5  Registration 3  Attention/ Calculation 4  Recall 2  Language- name 2 objects 2  Language- repeat 1  Language- follow 3 step command 3  Language- read & follow direction 1  Write a sentence 0  Copy design 0  Total score 25        Immunizations Immunization History  Administered Date(s) Administered  . Fluad Quad(high Dose 65+) 07/21/2019  . Influenza Whole 05/20/2007, 06/10/2008, 06/06/2009  . Influenza,  High Dose Seasonal PF 05/05/2015, 09/11/2017  . Influenza,inj,Quad PF,6+ Mos 05/19/2013, 06/22/2014  . Pneumococcal Conjugate-13 05/05/2015  . Pneumococcal Polysaccharide-23 09/11/2017  . Td 08/26/2007    TDAP status: Due, Education has been provided regarding the importance of this vaccine. Advised may receive this vaccine at local pharmacy or Health Dept. Aware to provide a copy of the vaccination record if obtained from local pharmacy or Health Dept. Verbalized acceptance and understanding.  {Flu Vaccine status:2101806}  Pneumococcal vaccine status: Up to date  {Covid-19 vaccine status:2101808}  Qualifies for Shingles Vaccine? Yes   Zostavax completed No   Shingrix Completed?: No.    Education has been provided regarding the importance of this vaccine. Patient has been advised to call insurance company to determine out of pocket expense if they have not yet received this vaccine. Advised may also receive vaccine at local pharmacy or Health Dept. Verbalized acceptance and understanding.  Screening Tests Health Maintenance  Topic Date Due  . COVID-19 Vaccine (1) Never done  . OPHTHALMOLOGY EXAM  Never done  . FOOT EXAM  09/11/2018  . INFLUENZA VACCINE  03/26/2020  . HEMOGLOBIN A1C  04/19/2020  . COLONOSCOPY (Pts 45-3yrs Insurance coverage will need to be confirmed)  10/31/2020  . Hepatitis C Screening  Completed  . PNA vac Low Risk  Adult  Completed    Health Maintenance  Health Maintenance Due  Topic Date Due  . COVID-19 Vaccine (1) Never done  . OPHTHALMOLOGY EXAM  Never done  . FOOT EXAM  09/11/2018  . INFLUENZA VACCINE  03/26/2020  . HEMOGLOBIN A1C  04/19/2020    Colorectal cancer screening: Type of screening: Colonoscopy. Completed 11/01/2010. Repeat every 10 years  Lung Cancer Screening: (Low Dose CT Chest recommended if Age 33-80 years, 30 pack-year currently smoking OR have quit w/in 15years.) does qualify.   Lung Cancer Screening Referral: ***  Additional Screening:  Hepatitis C Screening: does qualify; Completed 09/11/2017 Vision Screening: Recommended annual ophthalmology exams for early detection of glaucoma and other disorders of the eye. Is the patient up to date with their annual eye exam?  {YES/NO:21197} Who is the provider or what is the name of the office in which the patient attends annual eye exams? *** If pt is not established with a provider, would they like to be referred to a provider to establish care? {YES/NO:21197}.   Dental Screening: Recommended annual dental exams for proper oral hygiene  Community Resource Referral / Chronic Care Management: CRR required this visit?  {YES/NO:21197}  CCM required this visit?  {YES/NO:21197}     Plan:     I have personally reviewed and noted the following in the patient's chart:   . Medical and social history . Use of alcohol, tobacco or illicit drugs  . Current medications and supplements . Functional ability and status . Nutritional status . Physical activity . Advanced directives . List of other physicians . Hospitalizations, surgeries, and ER visits in previous 12 months . Vitals . Screenings to include cognitive, depression, and falls . Referrals and appointments  In addition, I have reviewed and discussed with patient certain preventive protocols, quality metrics, and best practice recommendations. A written personalized  care plan for preventive services as well as general preventive health recommendations were provided to patient.     Ofilia Neas, LPN   [id number]   Nurse Notes: ***

## 2020-09-14 NOTE — Progress Notes (Signed)
Erroneous Encounter

## 2020-10-31 ENCOUNTER — Telehealth: Payer: Self-pay | Admitting: Adult Health

## 2020-10-31 NOTE — Telephone Encounter (Signed)
Left message for patient to call back and schedule Medicare Annual Wellness Visit (AWV) either virtually or in office. No detailed message left   AWVI  please schedule at anytime with [facility] Nurse Health Advisor 1 or 2   This should be a 45 minute visit. 

## 2020-11-03 NOTE — Progress Notes (Unsigned)
Subjective:   Sean Myers is a 75 y.o. male who presents for an Initial Medicare Annual Wellness Visit.  Review of Systems    N/A        Objective:    There were no vitals filed for this visit. There is no height or weight on file to calculate BMI.  Advanced Directives 12/24/2013 12/31/2012 12/29/2012  Does Patient Have a Medical Advance Directive? Patient would not like information Patient does not have advance directive Patient does not have advance directive;Patient would not like information  Pre-existing out of facility DNR order (yellow form or pink MOST form) - - No    Current Medications (verified) Outpatient Encounter Medications as of 11/06/2020  Medication Sig  . alfuzosin (UROXATRAL) 10 MG 24 hr tablet take 1 tablet by mouth once daily WITH BREAKFAST  . amLODipine (NORVASC) 10 MG tablet Take 1 tablet (10 mg total) by mouth daily.  . clopidogrel (PLAVIX) 75 MG tablet Take 1 tablet (75 mg total) by mouth daily.  Marland Kitchen losartan (COZAAR) 50 MG tablet Take 1 tablet (50 mg total) by mouth daily.  . memantine (NAMENDA TITRATION PAK) tablet pack 5 mg/day for =1 week; 5 mg twice daily for =1 week; 15 mg/day given in 5 mg and 10 mg separated doses for =1 week; then 10 mg twice daily  . memantine (NAMENDA) 10 MG tablet Take 1 tablet (10 mg total) by mouth 2 (two) times daily.  . simvastatin (ZOCOR) 20 MG tablet TAKE 1 TABLET BY MOUTH DAILY AT 6PM  . venlafaxine XR (EFFEXOR-XR) 37.5 MG 24 hr capsule Take one tablet twice a day   No facility-administered encounter medications on file as of 11/06/2020.    Allergies (verified) Poliovirus vaccine inactivated, Aspirin, and Penicillins   History: Past Medical History:  Diagnosis Date  . Depression   . Diabetes mellitus, type II ([place])   . ED (erectile dysfunction)   . History of cerebrovascular accident   . Hyperlipidemia   . Stroke [facility])    Past Surgical History:  Procedure Laterality Date  . COLONOSCOPY  1990's   [facility]  . TEE WITHOUT CARDIOVERSION N/A 01/01/2013   Procedure: TRANSESOPHAGEAL ECHOCARDIOGRAM (TEE);  Surgeon: Larey Dresser, MD;  Location: [facility] ENDOSCOPY;  Service: Cardiovascular;  Laterality: N/A;   Family History  Problem Relation Age of Onset  . Heart disease Mother   . Other Other        no family history of colon cancer   Social History   Socioeconomic History  . Marital status: Married    Spouse name: Not on file  . Number of children: Not on file  . Years of education: Not on file  . Highest education level: Not on file  Occupational History  . Not on file  Tobacco Use  . Smoking status: Former Smoker    Packs/day: 1.00    Quit date: 02/15/2013    Years since quitting: 7.7  . Smokeless tobacco: Never Used  Substance and Sexual Activity  . Alcohol use: No  . Drug use: Yes    Types: Marijuana    Comment: marijuana  . Sexual activity: Yes    Partners: Female  Other Topics Concern  . Not on file  Social History Narrative   Retired   Married   Current Smoker    Alcohol use-no         son [name] living in [place] ([place]) -  engaged to be married   Social Determinants  of Health   Financial Resource Strain: Not on file  Food Insecurity: Not on file  Transportation Needs: Not on file  Physical Activity: Not on file  Stress: Not on file  Social Connections: Not on file    Tobacco Counseling Counseling given: Not Answered   Clinical Intake:                 Diabetic?Yes Nutrition Risk Assessment:  Has the patient had any N/V/D within the last 2 months?  {YES/NO:21197} Does the patient have any non-healing wounds?  {YES/NO:21197} Has the patient had any unintentional weight loss or weight gain?  {YES/NO:21197}  Diabetes:  Is the patient diabetic?  Yes  If diabetic, was a CBG obtained today?  No  Did the patient bring in their glucometer from home?  No  How often do you monitor your CBG's? ***.   Financial Strains and Diabetes  Management:  Are you having any financial strains with the device, your supplies or your medication? {YES/NO:21197}.  Does the patient want to be seen by Chronic Care Management for management of their diabetes?  {YES/NO:21197} Would the patient like to be referred to a Nutritionist or for Diabetic Management?  {YES/NO:21197}  Diabetic Exams:  Diabetic Eye Exam: Overdue for diabetic eye exam. Pt has been advised about the importance in completing this exam. Patient advised to call and schedule an eye exam. Diabetic Foot Exam: Overdue, Pt has been advised about the importance in completing this exam. Pt is scheduled for diabetic foot exam on ***.          Activities of Daily Living No flowsheet data found.  Patient Care Team: Dorothyann Peng, NP as PCP - General (Family Medicine) Shawna Orleans, Doe-Hyun R, DO (Inactive)  Indicate any recent Medical Services you may have received from other than [facility] providers in the past year (date may be approximate).     Assessment:   This is a routine wellness examination for [place].  Hearing/Vision screen No exam data present  Dietary issues and exercise activities discussed:    Goals   None    Depression Screen PHQ 2/9 Scores 09/11/2017 06/27/2016 05/05/2015 07/02/2013  PHQ - 2 Score 0 0 0 0    Fall Risk Fall Risk  03/25/2019 09/11/2017 06/27/2016 05/05/2015 07/02/2013  Falls in the past year? 1 Yes No No Yes  Comment Emmi Telephone Survey: data to providers prior to load - - - -  Number falls in past yr: 1 2 or more - - 1  Comment Emmi Telephone Survey Actual Response = 94 - - - -  Injury with Fall? 0 No - - No    FALL RISK PREVENTION PERTAINING TO THE HOME:  Any stairs in or around the home? {YES/NO:21197} If so, are there any without handrails? No  Home free of loose throw rugs in walkways, pet beds, electrical cords, etc? Yes  Adequate lighting in your home to reduce risk of falls? Yes   ASSISTIVE DEVICES UTILIZED TO PREVENT  FALLS:  Life alert? {YES/NO:21197} Use of a cane, walker or w/c? {YES/NO:21197} Grab bars in the bathroom? {YES/NO:21197} Shower chair or bench in shower? {YES/NO:21197} Elevated toilet seat or a handicapped toilet? {YES/NO:21197}  TIMED UP AND GO:  Was the test performed? Yes .  Length of time to ambulate 10 feet: *** sec.   {Appearance of EVOJ:5009381}  Cognitive Function: MMSE - Mini Mental State Exam 02/12/2018  Orientation to time 4  Orientation to Place 5  Registration 3  Attention/  Calculation 4  Recall 2  Language- name 2 objects 2  Language- repeat 1  Language- follow 3 step command 3  Language- read & follow direction 1  Write a sentence 0  Copy design 0  Total score 25        Immunizations Immunization History  Administered Date(s) Administered  . Fluad Quad(high Dose 65+) 07/21/2019  . Influenza Whole 05/20/2007, 06/10/2008, 06/06/2009  . Influenza, High Dose Seasonal PF 05/05/2015, 09/11/2017  . Influenza,inj,Quad PF,6+ Mos 05/19/2013, 06/22/2014  . Pneumococcal Conjugate-13 05/05/2015  . Pneumococcal Polysaccharide-23 09/11/2017  . Td 08/26/2007    TDAP status: Due, Education has been provided regarding the importance of this vaccine. Advised may receive this vaccine at local pharmacy or Health Dept. Aware to provide a copy of the vaccination record if obtained from local pharmacy or Health Dept. Verbalized acceptance and understanding.  {Flu Vaccine status:2101806}  Pneumococcal vaccine status: Up to date  {Covid-19 vaccine status:2101808}  Qualifies for Shingles Vaccine? Yes   Zostavax completed No   Shingrix Completed?: No.    Education has been provided regarding the importance of this vaccine. Patient has been advised to call insurance company to determine out of pocket expense if they have not yet received this vaccine. Advised may also receive vaccine at local pharmacy or Health Dept. Verbalized acceptance and understanding.  Screening  Tests Health Maintenance  Topic Date Due  . COVID-19 Vaccine (1) Never done  . OPHTHALMOLOGY EXAM  Never done  . FOOT EXAM  09/11/2018  . INFLUENZA VACCINE  03/26/2020  . HEMOGLOBIN A1C  04/19/2020  . COLONOSCOPY (Pts 45-98yrs Insurance coverage will need to be confirmed)  10/31/2020  . Hepatitis C Screening  Completed  . PNA vac Low Risk Adult  Completed  . HPV VACCINES  Aged Out    Health Maintenance  Health Maintenance Due  Topic Date Due  . COVID-19 Vaccine (1) Never done  . OPHTHALMOLOGY EXAM  Never done  . FOOT EXAM  09/11/2018  . INFLUENZA VACCINE  03/26/2020  . HEMOGLOBIN A1C  04/19/2020  . COLONOSCOPY (Pts 45-31yrs Insurance coverage will need to be confirmed)  10/31/2020    Colorectal cancer screening: Referral to GI placed 11/06/2020. Pt aware the office will call re: appt.  Lung Cancer Screening: (Low Dose CT Chest recommended if Age 20-80 years, 30 pack-year currently smoking OR have quit w/in 15years.) does qualify.   Lung Cancer Screening Referral: ***  Additional Screening:  Hepatitis C Screening: does qualify; Completed 09/11/2017  Vision Screening: Recommended annual ophthalmology exams for early detection of glaucoma and other disorders of the eye. Is the patient up to date with their annual eye exam?  {YES/NO:21197} Who is the provider or what is the name of the office in which the patient attends annual eye exams? *** If pt is not established with a provider, would they like to be referred to a provider to establish care? {YES/NO:21197}.   Dental Screening: Recommended annual dental exams for proper oral hygiene  Community Resource Referral / Chronic Care Management: CRR required this visit?  No   CCM required this visit?  No      Plan:     I have personally reviewed and noted the following in the patient's chart:   . Medical and social history . Use of alcohol, tobacco or illicit drugs  . Current medications and supplements . Functional  ability and status . Nutritional status . Physical activity . Advanced directives . List of other physicians . Hospitalizations,  surgeries, and ER visits in previous 12 months . Vitals . Screenings to include cognitive, depression, and falls . Referrals and appointments  In addition, I have reviewed and discussed with patient certain preventive protocols, quality metrics, and best practice recommendations. A written personalized care plan for preventive services as well as general preventive health recommendations were provided to patient.     Ofilia Neas, LPN   [id number]   Nurse Notes: None

## 2020-11-06 ENCOUNTER — Ambulatory Visit: Payer: Medicare Other

## 2020-11-06 DIAGNOSIS — Z0000 Encounter for general adult medical examination without abnormal findings: Secondary | ICD-10-CM

## 2020-11-22 DIAGNOSIS — Z0100 Encounter for examination of eyes and vision without abnormal findings: Secondary | ICD-10-CM | POA: Insufficient documentation

## 2020-11-22 NOTE — Progress Notes (Addendum)
Subjective:   Sean Myers is a 75 y.o. male who presents for an Initial Medicare Annual Wellness Visit.  Review of Systems   N/A Cardiac Risk Factors include: advanced age (>54men, >61 women);diabetes mellitus;hypertension;male gender;dyslipidemia     Objective:    Today's Vitals   There is no height or weight on file to calculate BMI.  Advanced Directives 12/24/2013 12/31/2012 12/29/2012  Does Patient Have a Medical Advance Directive? Patient would not like information Patient does not have advance directive Patient does not have advance directive;Patient would not like information  Pre-existing out of facility DNR order (yellow form or pink MOST form) - - No    Current Medications (verified) Outpatient Encounter Medications as of 11/23/2020  Medication Sig  . alfuzosin (UROXATRAL) 10 MG 24 hr tablet take 1 tablet by mouth once daily WITH BREAKFAST  . amLODipine (NORVASC) 10 MG tablet Take 1 tablet (10 mg total) by mouth daily.  Marland Kitchen losartan (COZAAR) 50 MG tablet Take 1 tablet (50 mg total) by mouth daily.  . simvastatin (ZOCOR) 20 MG tablet TAKE 1 TABLET BY MOUTH DAILY AT 6PM  . venlafaxine XR (EFFEXOR-XR) 37.5 MG 24 hr capsule Take one tablet twice a day  . clopidogrel (PLAVIX) 75 MG tablet Take 1 tablet (75 mg total) by mouth daily. (Patient not taking: Reported on 11/23/2020)  . memantine (NAMENDA TITRATION PAK) tablet pack 5 mg/day for =1 week; 5 mg twice daily for =1 week; 15 mg/day given in 5 mg and 10 mg separated doses for =1 week; then 10 mg twice daily (Patient not taking: Reported on 11/23/2020)  . memantine (NAMENDA) 10 MG tablet Take 1 tablet (10 mg total) by mouth 2 (two) times daily.   No facility-administered encounter medications on file as of 11/23/2020.    Allergies (verified) Poliovirus vaccine inactivated, Aspirin, and Penicillins   History: Past Medical History:  Diagnosis Date  . Depression   . Diabetes mellitus, type II ([place])   . ED (erectile  dysfunction)   . History of cerebrovascular accident   . Hyperlipidemia   . Stroke [facility])    Past Surgical History:  Procedure Laterality Date  . COLONOSCOPY  1990's   [facility]  . TEE WITHOUT CARDIOVERSION N/A 01/01/2013   Procedure: TRANSESOPHAGEAL ECHOCARDIOGRAM (TEE);  Surgeon: Larey Dresser, MD;  Location: [facility] ENDOSCOPY;  Service: Cardiovascular;  Laterality: N/A;   Family History  Problem Relation Age of Onset  . Heart disease Mother   . Other Other        no family history of colon cancer   Social History   Socioeconomic History  . Marital status: Married    Spouse name: Not on file  . Number of children: Not on file  . Years of education: Not on file  . Highest education level: Not on file  Occupational History  . Not on file  Tobacco Use  . Smoking status: Former Smoker    Packs/day: 1.00    Quit date: 02/15/2013    Years since quitting: 7.7  . Smokeless tobacco: Never Used  Substance and Sexual Activity  . Alcohol use: No  . Drug use: Yes    Types: Marijuana    Comment: marijuana  . Sexual activity: Yes    Partners: Female  Other Topics Concern  . Not on file  Social History Narrative   Retired   Married   Current Smoker    Alcohol use-no         son -  Tafari living in [place] ([place]) -  engaged to be married   Social Determinants of Radio broadcast assistant Strain: Not on file  Food Insecurity: Not on file  Transportation Needs: Not on file  Physical Activity: Not on file  Stress: Not on file  Social Connections: Not on file    Tobacco Counseling Counseling given: Not Answered   Clinical Intake:  Pre-visit preparation completed: Yes  Pain : No/denies pain     Diabetes: Yes CBG done?: No Did pt. bring in CBG monitor from home?: No  How often do you need to have someone help you when you read instructions, pamphlets, or other written materials from your doctor or pharmacy?: 1 - Never What is the last grade level you  completed in school?: 2 years of college  Diabetic?Yes Nutrition Risk Assessment:  Has the patient had any N/V/D within the last 2 months?  No  Does the patient have any non-healing wounds?  No  Has the patient had any unintentional weight loss or weight gain?  No   Diabetes:  Is the patient diabetic?  Yes  If diabetic, was a CBG obtained today?  No  Did the patient bring in their glucometer from home?  No  How often do you monitor your CBG's? Never.   Financial Strains and Diabetes Management:  Are you having any financial strains with the device, your supplies or your medication? No .  Does the patient want to be seen by Chronic Care Management for management of their diabetes?  No  Would the patient like to be referred to a Nutritionist or for Diabetic Management?  No   Diabetic Exams:  Diabetic Eye Exam: Overdue for diabetic eye exam. Pt has been advised about the importance in completing this exam. Patient advised to call and schedule an eye exam. Diabetic Foot Exam: Overdue, Pt has been advised about the importance in completing this exam. Pt is scheduled for diabetic foot exam on with Dorothyann Peng next office visit .   Interpreter Needed?: No  Information entered by :: Randel Pigg, LPN   Activities of Daily Living In your present state of health, do you have any difficulty performing the following activities: 11/23/2020  Hearing? N  Vision? N  Difficulty concentrating or making decisions? Y  Comment per patient wife has some short term memory loss  Walking or climbing stairs? N  Dressing or bathing? N  Doing errands, shopping? N  Preparing Food and eating ? N  Using the Toilet? N  In the past six months, have you accidently leaked urine? N  Do you have problems with loss of bowel control? N  Managing your Medications? N  Managing your Finances? N  Housekeeping or managing your Housekeeping? N  Some recent data might be hidden    Patient Care Team: Dorothyann Peng, NP as PCP - General (Family Medicine) Shawna Orleans, Doe-Hyun R, DO (Inactive)  Indicate any recent Medical Services you may have received from other than [facility] providers in the past year (date may be approximate).     Assessment:   This is a routine wellness examination for [place].  Hearing/Vision screen  Hearing Screening   125Hz  250Hz  500Hz  1000Hz  2000Hz  3000Hz  4000Hz  6000Hz  8000Hz   Right ear:           Left ear:           Comments: Patient request referral eye exam    Dietary issues and exercise activities discussed: Current Exercise Habits: Home exercise  routine, Time (Minutes): 10, Frequency (Times/Week): 1, Weekly Exercise (Minutes/Week): 10, Exercise limited by: neurologic condition(s);cardiac condition(s);respiratory conditions(s)  Goals   None    Depression Screen PHQ 2/9 Scores 11/23/2020 09/11/2017 06/27/2016 05/05/2015 07/02/2013  PHQ - 2 Score 0 0 0 0 0    Fall Risk Fall Risk  11/23/2020 03/25/2019 09/11/2017 06/27/2016 05/05/2015  Falls in the past year? 0 1 Yes No No  Comment - Emmi Telephone Survey: data to providers prior to load - - -  Number falls in past yr: 0 1 2 or more - -  Comment - Emmi Telephone Survey Actual Response = 94 - - -  Injury with Fall? 0 0 No - -  Risk for fall due to : Impaired balance/gait - - - -    FALL RISK PREVENTION PERTAINING TO THE HOME:  Any stairs in or around the home? Yes  If so, are there any without handrails? Yes  Home free of loose throw rugs in walkways, pet beds, electrical cords, etc? Yes  Adequate lighting in your home to reduce risk of falls? Yes   ASSISTIVE DEVICES UTILIZED TO PREVENT FALLS:  Life alert? No  Use of a cane, walker or w/c? No  Grab bars in the bathroom? No  Shower chair or bench in shower? No  Elevated toilet seat or a handicapped toilet? No    Cognitive Function:   Cognitive status assessed by direct observation. Patient has current diagnosis of cognitive impairment. Patient is followed by neurology  for ongoing assessment. Patient is unable to complete screening 6CIT or MMSE.  Score of 16  MMSE - Mini Mental State Exam 02/12/2018  Orientation to time 4  Orientation to Place 5  Registration 3  Attention/ Calculation 4  Recall 2  Language- name 2 objects 2  Language- repeat 1  Language- follow 3 step command 3  Language- read & follow direction 1  Write a sentence 0  Copy design 0  Total score 25     6CIT Screen 11/23/2020  What Year? 0 points  What month? 0 points  What time? 0 points  Count back from 20 4 points  Months in reverse 4 points  Repeat phrase 8 points  Total Score 16    Immunizations Immunization History  Administered Date(s) Administered  . Fluad Quad(high Dose 65+) 07/21/2019  . Influenza Whole 05/20/2007, 06/10/2008, 06/06/2009  . Influenza, High Dose Seasonal PF 05/05/2015, 09/11/2017  . Influenza,inj,Quad PF,6+ Mos 05/19/2013, 06/22/2014  . Pneumococcal Conjugate-13 05/05/2015  . Pneumococcal Polysaccharide-23 09/11/2017  . Td 08/26/2007    TDAP status: Due, Education has been provided regarding the importance of this vaccine. Advised may receive this vaccine at local pharmacy or Health Dept. Aware to provide a copy of the vaccination record if obtained from local pharmacy or Health Dept. Verbalized acceptance and understanding.  Flu Vaccine status: Due, Education has been provided regarding the importance of this vaccine. Advised may receive this vaccine at local pharmacy or Health Dept. Aware to provide a copy of the vaccination record if obtained from local pharmacy or Health Dept. Verbalized acceptance and understanding.  Pnuemonia vaccine: completed  Series  Covid-19 vaccine status: Completed vaccines  Qualifies for Shingles Vaccine? Yes   Zostavax completed No   Shingrix Completed?: Yes  Screening Tests Health Maintenance  Topic Date Due  . COVID-19 Vaccine (1) Never done  . OPHTHALMOLOGY EXAM  Never done  . FOOT EXAM  09/11/2018  .  INFLUENZA VACCINE  03/26/2020  . HEMOGLOBIN  A1C  04/19/2020  . COLONOSCOPY (Pts 45-23yrs Insurance coverage will need to be confirmed)  10/31/2020  . Hepatitis C Screening  Completed  . PNA vac Low Risk Adult  Completed  . HPV VACCINES  Aged Out    Health Maintenance  Health Maintenance Due  Topic Date Due  . COVID-19 Vaccine (1) Never done  . OPHTHALMOLOGY EXAM  Never done  . FOOT EXAM  09/11/2018  . INFLUENZA VACCINE  03/26/2020  . HEMOGLOBIN A1C  04/19/2020  . COLONOSCOPY (Pts 45-57yrs Insurance coverage will need to be confirmed)  10/31/2020    Colorectal cancer screening: Referral to GI placed 11/22/2020. Pt aware the office will call re: appt.  Lung Cancer Screening: (Low Dose CT Chest recommended if Age 78-80 years, 30 pack-year currently smoking OR have quit w/in 15years.) does qualify.   Lung Cancer Screening Referral: ordered 11/22/2020   Additional Screening:  Hepatitis C Screening: does qualify; Completed 09/11/2017  Vision Screening: Recommended annual ophthalmology exams for early detection of glaucoma and other disorders of the eye. Is the patient up to date with their annual eye exam?  No  Who is the provider or what is the name of the office in which the patient attends annual eye exams? unknown If pt is not established with a provider, would they like to be referred to a provider to establish care? Yes .   Dental Screening: Recommended annual dental exams for proper oral hygiene  Community Resource Referral / Chronic Care Management: CRR required this visit?  No   CCM required this visit?  No      Plan:     I have personally reviewed and noted the following in the patient's chart:   . Medical and social history . Use of alcohol, tobacco or illicit drugs  . Current medications and supplements . Functional ability and status . Nutritional status . Physical activity . Advanced directives . List of other physicians . Hospitalizations, surgeries,  and ER visits in previous 12 months . Vitals . Screenings to include cognitive, depression, and falls . Referrals and appointments  In addition, I have reviewed and discussed with patient certain preventive protocols, quality metrics, and best practice recommendations. A written personalized care plan for preventive services as well as general preventive health recommendations were provided to patient.     Randel Pigg, LPN   0/04/2329   Nurse Notes:  Patient present with a productive cough and congestion , Zada Girt  Will see patient today and evaluate

## 2020-11-23 ENCOUNTER — Ambulatory Visit (INDEPENDENT_AMBULATORY_CARE_PROVIDER_SITE_OTHER): Payer: Medicare Other

## 2020-11-23 ENCOUNTER — Other Ambulatory Visit: Payer: Self-pay

## 2020-11-23 ENCOUNTER — Ambulatory Visit (INDEPENDENT_AMBULATORY_CARE_PROVIDER_SITE_OTHER): Payer: Medicare Other | Admitting: Adult Health

## 2020-11-23 DIAGNOSIS — Z0000 Encounter for general adult medical examination without abnormal findings: Secondary | ICD-10-CM

## 2020-11-23 DIAGNOSIS — Z0100 Encounter for examination of eyes and vision without abnormal findings: Secondary | ICD-10-CM | POA: Diagnosis not present

## 2020-11-23 DIAGNOSIS — R0989 Other specified symptoms and signs involving the circulatory and respiratory systems: Secondary | ICD-10-CM | POA: Diagnosis not present

## 2020-11-23 DIAGNOSIS — J988 Other specified respiratory disorders: Secondary | ICD-10-CM

## 2020-11-23 DIAGNOSIS — Z1211 Encounter for screening for malignant neoplasm of colon: Secondary | ICD-10-CM

## 2020-11-23 DIAGNOSIS — Z122 Encounter for screening for malignant neoplasm of respiratory organs: Secondary | ICD-10-CM

## 2020-11-23 DIAGNOSIS — E119 Type 2 diabetes mellitus without complications: Secondary | ICD-10-CM

## 2020-11-23 DIAGNOSIS — R062 Wheezing: Secondary | ICD-10-CM | POA: Diagnosis not present

## 2020-11-23 DIAGNOSIS — Z87891 Personal history of nicotine dependence: Secondary | ICD-10-CM | POA: Diagnosis not present

## 2020-11-23 MED ORDER — DOXYCYCLINE HYCLATE 100 MG PO CAPS: 100.0000 mg | ORAL_CAPSULE | Freq: Two times a day (BID) | ORAL | 0 refills | Status: AC

## 2020-11-23 MED ORDER — PREDNISONE 10 MG PO TABS: ORAL_TABLET | ORAL | 0 refills | Status: AC

## 2020-11-23 NOTE — Patient Instructions (Signed)
Sean Myers , Thank you for taking time to come for your Medicare Wellness Visit. I appreciate your ongoing commitment to your health goals. Please review the following plan we discussed and let me know if I can assist you in the future.   Screening recommendations/referrals: Colonoscopy: referral completed   Recommended yearly ophthalmology/optometry visit for glaucoma screening and checkup Recommended yearly dental visit for hygiene and checkup  Vaccinations: Influenza vaccine: completed due fall 2022 Pneumococcal vaccine: completed series  Tdap vaccine: due upon injury Shingles vaccine: will obtain from the pharmacy    Advanced directives: provided packet will complete and return copies   Conditions/risks identified: cough congestion x 1 week made appt Dorothyann Peng for evaluation   Next appointment: April 5th (Tuesday ) @230pm   Preventive Care 65 Years and Older, Male Preventive care refers to lifestyle choices and visits with your health care provider that can promote health and wellness. What does preventive care include?  A yearly physical exam. This is also called an annual well check.  Dental exams once or twice a year.  Routine eye exams. Ask your health care provider how often you should have your eyes checked.  Personal lifestyle choices, including:  Daily care of your teeth and gums.  Regular physical activity.  Eating a healthy diet.  Avoiding tobacco and drug use.  Limiting alcohol use.  Practicing safe sex.  Taking low doses of aspirin every day.  Taking vitamin and mineral supplements as recommended by your health care provider. What happens during an annual well check? The services and screenings done by your health care provider during your annual well check will depend on your age, overall health, lifestyle risk factors, and family history of disease. Counseling  Your health care provider may ask you questions about your:  Alcohol use.  Tobacco  use.  Drug use.  Emotional well-being.  Home and relationship well-being.  Sexual activity.  Eating habits.  History of falls.  Memory and ability to understand (cognition).  Work and work Statistician. Screening  You may have the following tests or measurements:  Height, weight, and BMI.  Blood pressure.  Lipid and cholesterol levels. These may be checked every 5 years, or more frequently if you are over 40 years old.  Skin check.  Lung cancer screening. You may have this screening every year starting at age 56 if you have a 30-pack-year history of smoking and currently smoke or have quit within the past 15 years.  Fecal occult blood test (FOBT) of the stool. You may have this test every year starting at age 22.  Flexible sigmoidoscopy or colonoscopy. You may have a sigmoidoscopy every 5 years or a colonoscopy every 10 years starting at age 41.  Prostate cancer screening. Recommendations will vary depending on your family history and other risks.  Hepatitis C blood test.  Hepatitis B blood test.  Sexually transmitted disease (STD) testing.  Diabetes screening. This is done by checking your blood sugar (glucose) after you have not eaten for a while (fasting). You may have this done every 1-3 years.  Abdominal aortic aneurysm (AAA) screening. You may need this if you are a current or former smoker.  Osteoporosis. You may be screened starting at age 72 if you are at high risk. Talk with your health care provider about your test results, treatment options, and if necessary, the need for more tests. Vaccines  Your health care provider may recommend certain vaccines, such as:  Influenza vaccine. This is recommended every year.  Tetanus, diphtheria, and acellular pertussis (Tdap, Td) vaccine. You may need a Td booster every 10 years.  Zoster vaccine. You may need this after age 11.  Pneumococcal 13-valent conjugate (PCV13) vaccine. One dose is recommended after age  6.  Pneumococcal polysaccharide (PPSV23) vaccine. One dose is recommended after age 69. Talk to your health care provider about which screenings and vaccines you need and how often you need them. This information is not intended to replace advice given to you by your health care provider. Make sure you discuss any questions you have with your health care provider. Document Released: 09/08/2015 Document Revised: 05/01/2016 Document Reviewed: 06/13/2015 Elsevier Interactive Patient Education  2017 [place] Prevention in the Home Falls can cause injuries. They can happen to people of all ages. There are many things you can do to make your home safe and to help prevent falls. What can I do on the outside of my home?  Regularly fix the edges of walkways and driveways and fix any cracks.  Remove anything that might make you trip as you walk through a door, such as a raised step or threshold.  Trim any bushes or trees on the path to your home.  Use bright outdoor lighting.  Clear any walking paths of anything that might make someone trip, such as rocks or tools.  Regularly check to see if handrails are loose or broken. Make sure that both sides of any steps have handrails.  Any raised decks and porches should have guardrails on the edges.  Have any leaves, snow, or ice cleared regularly.  Use sand or salt on walking paths during winter.  Clean up any spills in your garage right away. This includes oil or grease spills. What can I do in the bathroom?  Use night lights.  Install grab bars by the toilet and in the tub and shower. Do not use towel bars as grab bars.  Use non-skid mats or decals in the tub or shower.  If you need to sit down in the shower, use a plastic, non-slip stool.  Keep the floor dry. Clean up any water that spills on the floor as soon as it happens.  Remove soap buildup in the tub or shower regularly.  Attach bath mats securely with double-sided  non-slip rug tape.  Do not have throw rugs and other things on the floor that can make you trip. What can I do in the bedroom?  Use night lights.  Make sure that you have a light by your bed that is easy to reach.  Do not use any sheets or blankets that are too big for your bed. They should not hang down onto the floor.  Have a firm chair that has side arms. You can use this for support while you get dressed.  Do not have throw rugs and other things on the floor that can make you trip. What can I do in the kitchen?  Clean up any spills right away.  Avoid walking on wet floors.  Keep items that you use a lot in easy-to-reach places.  If you need to reach something above you, use a strong step stool that has a grab bar.  Keep electrical cords out of the way.  Do not use floor polish or wax that makes floors slippery. If you must use wax, use non-skid floor wax.  Do not have throw rugs and other things on the floor that can make you trip. What can I do  with my stairs?  Do not leave any items on the stairs.  Make sure that there are handrails on both sides of the stairs and use them. Fix handrails that are broken or loose. Make sure that handrails are as long as the stairways.  Check any carpeting to make sure that it is firmly attached to the stairs. Fix any carpet that is loose or worn.  Avoid having throw rugs at the top or bottom of the stairs. If you do have throw rugs, attach them to the floor with carpet tape.  Make sure that you have a light switch at the top of the stairs and the bottom of the stairs. If you do not have them, ask someone to add them for you. What else can I do to help prevent falls?  Wear shoes that:  Do not have high heels.  Have rubber bottoms.  Are comfortable and fit you well.  Are closed at the toe. Do not wear sandals.  If you use a stepladder:  Make sure that it is fully opened. Do not climb a closed stepladder.  Make sure that both  sides of the stepladder are locked into place.  Ask someone to hold it for you, if possible.  Clearly mark and make sure that you can see:  Any grab bars or handrails.  First and last steps.  Where the edge of each step is.  Use tools that help you move around (mobility aids) if they are needed. These include:  Canes.  Walkers.  Scooters.  Crutches.  Turn on the lights when you go into a dark area. Replace any light bulbs as soon as they burn out.  Set up your furniture so you have a clear path. Avoid moving your furniture around.  If any of your floors are uneven, fix them.  If there are any pets around you, be aware of where they are.  Review your medicines with your doctor. Some medicines can make you feel dizzy. This can increase your chance of falling. Ask your doctor what other things that you can do to help prevent falls. This information is not intended to replace advice given to you by your health care provider. Make sure you discuss any questions you have with your health care provider. Document Released: 06/08/2009 Document Revised: 01/18/2016 Document Reviewed: 09/16/2014 Elsevier Interactive Patient Education  2017 [employer].

## 2020-11-23 NOTE — Progress Notes (Signed)
Subjective:    Patient ID: Sean Myers, male    DOB: July 25, 1946, 75 y.o.   MRN: [id number]  HPI  75 year old male who  has a past medical history of Depression, Diabetes mellitus, type II ([place]), ED (erectile dysfunction), History of cerebrovascular accident, Hyperlipidemia, and Stroke ([place]).  He presents to the office today with his wife for an acute issue.  His wife reports that over the last week or so he is seemed more congested in his nasal passageway as well as his chest.  He has been in a productive cough as well as wheezing.  Denies fevers or chills.  Review of Systems See HPI   Past Medical History:  Diagnosis Date  . Depression   . Diabetes mellitus, type II ([place])   . ED (erectile dysfunction)   . History of cerebrovascular accident   . Hyperlipidemia   . Stroke [facility])     Social History   Socioeconomic History  . Marital status: Married    Spouse name: Not on file  . Number of children: Not on file  . Years of education: Not on file  . Highest education level: Not on file  Occupational History  . Not on file  Tobacco Use  . Smoking status: Former Smoker    Packs/day: 1.00    Quit date: 02/15/2013    Years since quitting: 7.7  . Smokeless tobacco: Never Used  Substance and Sexual Activity  . Alcohol use: No  . Drug use: Yes    Types: Marijuana    Comment: marijuana  . Sexual activity: Yes    Partners: Female  Other Topics Concern  . Not on file  Social History Narrative   Retired   Married   Current Smoker    Alcohol use-no         son [name] living in [place] ([place]) -  engaged to be married   Social Determinants of Radio broadcast assistant Strain: Low Risk   . Difficulty of Paying Living Expenses: Not hard at all  Food Insecurity: No Food Insecurity  . Worried About Charity fundraiser in the Last Year: Never true  . Ran Out of Food in the Last Year: Never true  Transportation Needs: No Transportation Needs  . Lack of  Transportation (Medical): No  . Lack of Transportation (Non-Medical): No  Physical Activity: Inactive  . Days of Exercise per Week: 0 days  . Minutes of Exercise per Session: 0 min  Stress: No Stress Concern Present  . Feeling of Stress : Not at all  Social Connections: Moderately Isolated  . Frequency of Communication with Friends and Family: More than three times a week  . Frequency of Social Gatherings with Friends and Family: More than three times a week  . Attends Religious Services: Never  . Active Member of Clubs or Organizations: No  . Attends Archivist Meetings: Never  . Marital Status: Married  Human resources officer Violence: Not At Risk  . Fear of Current or Ex-Partner: No  . Emotionally Abused: No  . Physically Abused: No  . Sexually Abused: No    Past Surgical History:  Procedure Laterality Date  . COLONOSCOPY  1990's   [facility]  . TEE WITHOUT CARDIOVERSION N/A 01/01/2013   Procedure: TRANSESOPHAGEAL ECHOCARDIOGRAM (TEE);  Surgeon: Larey Dresser, MD;  Location: [facility] ENDOSCOPY;  Service: Cardiovascular;  Laterality: N/A;    Family History  Problem Relation Age of Onset  . Heart  disease Mother   . Other Other        no family history of colon cancer    Allergies  Allergen Reactions  . Poliovirus Vaccine Inactivated     Heart races, cold sweats, "almost killed me".  . Aspirin     Gi upset  . Penicillins Swelling, Palpitations and Rash    Current Outpatient Medications on File Prior to Visit  Medication Sig Dispense Refill  . alfuzosin (UROXATRAL) 10 MG 24 hr tablet take 1 tablet by mouth once daily WITH BREAKFAST 90 tablet 3  . amLODipine (NORVASC) 10 MG tablet Take 1 tablet (10 mg total) by mouth daily. 90 tablet 3  . clopidogrel (PLAVIX) 75 MG tablet Take 1 tablet (75 mg total) by mouth daily. (Patient not taking: Reported on 11/23/2020) 90 tablet 3  . losartan (COZAAR) 50 MG tablet Take 1 tablet (50 mg total) by mouth daily. 90 tablet 3  .  memantine (NAMENDA TITRATION PAK) tablet pack 5 mg/day for =1 week; 5 mg twice daily for =1 week; 15 mg/day given in 5 mg and 10 mg separated doses for =1 week; then 10 mg twice daily (Patient not taking: Reported on 11/23/2020) 49 tablet 0  . memantine (NAMENDA) 10 MG tablet Take 1 tablet (10 mg total) by mouth 2 (two) times daily. 180 tablet 3  . simvastatin (ZOCOR) 20 MG tablet TAKE 1 TABLET BY MOUTH DAILY AT 6PM 90 tablet 3  . venlafaxine XR (EFFEXOR-XR) 37.5 MG 24 hr capsule Take one tablet twice a day 90 capsule 1   No current facility-administered medications on file prior to visit.    There were no vitals taken for this visit.     Objective:   Physical Exam Vitals and nursing note reviewed.  Constitutional:      Appearance: Normal appearance.  HENT:     Right Ear: Tympanic membrane, ear canal and external ear normal.     Left Ear: Tympanic membrane, ear canal and external ear normal. There is no impacted cerumen.     Nose: Congestion and rhinorrhea present.     Mouth/Throat:     Mouth: Mucous membranes are moist.  Eyes:     Extraocular Movements: Extraocular movements intact.     Pupils: Pupils are equal, round, and reactive to light.  Cardiovascular:     Rate and Rhythm: Normal rate and regular rhythm.     Pulses: Normal pulses.     Heart sounds: Normal heart sounds.  Pulmonary:     Effort: Pulmonary effort is normal.     Breath sounds: Wheezing and rhonchi present.  Abdominal:     Palpations: Abdomen is soft.  Musculoskeletal:        General: Normal range of motion.  Skin:    General: Skin is warm.     Capillary Refill: Capillary refill takes less than 2 seconds.  Neurological:     General: No focal deficit present.     Mental Status: He is alert and oriented to person, place, and time.  Psychiatric:        Mood and Affect: Mood normal.        Behavior: Behavior normal.        Thought Content: Thought content normal.        Judgment: Judgment normal.        Assessment & Plan:  1. Respiratory infection - Concern for pneumonia  - DG Chest 2 View; Future - doxycycline (VIBRAMYCIN) 100 MG capsule; Take 1 capsule (100  mg total) by mouth 2 (two) times daily.  Dispense: 20 capsule; Refill: 0 - predniSONE (DELTASONE) 10 MG tablet; 40 mg x 3 days, 20 mg x 3 days, 10 mg x 3 days  Dispense: 21 tablet; Refill: 0   Dorothyann Peng, NP

## 2020-11-28 ENCOUNTER — Ambulatory Visit: Payer: Medicare Other | Admitting: Adult Health

## 2021-01-18 DIAGNOSIS — H401133 Primary open-angle glaucoma, bilateral, severe stage: Secondary | ICD-10-CM | POA: Diagnosis not present

## 2021-01-18 DIAGNOSIS — H2513 Age-related nuclear cataract, bilateral: Secondary | ICD-10-CM | POA: Diagnosis not present

## 2021-01-18 DIAGNOSIS — H1045 Other chronic allergic conjunctivitis: Secondary | ICD-10-CM | POA: Diagnosis not present

## 2021-01-18 LAB — HM DIABETES EYE EXAM

## 2021-04-17 ENCOUNTER — Encounter: Payer: Self-pay | Admitting: Internal Medicine

## 2021-04-19 ENCOUNTER — Telehealth: Payer: Self-pay | Admitting: Adult Health

## 2021-04-19 NOTE — Telephone Encounter (Signed)
Patient's spouse called on behalf of patient to get an updated medication list.     Good callback number is [phone]    Please Advise

## 2021-04-19 NOTE — Telephone Encounter (Signed)
Spoke to pt spouse and she prefers to pick up med list for pt to take to the [place]. No further action needed. Watson placed in front office filing cabinet.

## 2021-11-13 ENCOUNTER — Ambulatory Visit
Admission: RE | Admit: 2021-11-13 | Discharge: 2021-11-13 | Disposition: A | Discharge status: Home / Self Care | Payer: Self-pay | Source: Ambulatory Visit | Attending: Radiation Oncology | Admitting: Radiation Oncology

## 2021-11-13 ENCOUNTER — Other Ambulatory Visit: Payer: Self-pay | Admitting: Radiation Oncology

## 2021-11-13 DIAGNOSIS — C61 Malignant neoplasm of prostate: Secondary | ICD-10-CM

## 2021-11-21 ENCOUNTER — Telehealth: Payer: Self-pay | Admitting: Adult Health

## 2021-11-21 NOTE — Telephone Encounter (Signed)
Tried calling patient to schedule Medicare Annual Wellness Visit (AWV) either virtually or in office.  ? ?Different person on voicemail did not leave message  sent my chart message ? ?Last AWV 11/23/20 please schedule at anytime with [facility] Nurse Health Advisor 1 or 2 ? ? ?Patient also needs appt with PCP ?

## 2021-11-26 NOTE — Progress Notes (Signed)
GU Location of Tumor / Histology: Metastatic castrate sensitive prostate cancer ? ?If Prostate Cancer, Gleason Score is (4 + 4) and PSA is (8.4 as of 08/2021) ? ?Biopsies  ?Dr. Tretha Sciara de [place] ? ? ? ?Past/Anticipated interventions by urology, if any: NA ? ?Past/Anticipated interventions by medical oncology, if any: NA ? ?Weight changes, if any:  Yes, 2.5 lbs per patient. ? ?IPSS:  7 ?SHIM:  23 ? ?Bowel/Bladder complaints, if any:   No ? ?Nausea/Vomiting, if any:  Nausea no vomiting. ? ?Pain issues, if any:    0/10 ? ?SAFETY ISSUES:  ?Prior radiation? No ?Pacemaker/ICD?  No ?Possible current pregnancy?  Male ?Is the patient on methotrexate?  No ?Ambulatory status?  [place] ? ?Current Complaints / other details:  07/2021 Initiated degarelix, plan for 3 doses with transition to Eligard if tolerating well.  Abiraterone/prednisone initiated 07/2021. Dementia predates starting ADT.  Alfuzosin (Uroxatrol) for BPH without lower urinary tract symptoms. ?

## 2021-11-27 ENCOUNTER — Ambulatory Visit
Admission: RE | Admit: 2021-11-27 | Discharge: 2021-11-27 | Disposition: A | Discharge status: Home / Self Care | Payer: Medicare Other | Source: Ambulatory Visit | Attending: Radiation Oncology | Admitting: Radiation Oncology

## 2021-11-27 ENCOUNTER — Other Ambulatory Visit: Payer: Self-pay

## 2021-11-27 DIAGNOSIS — C61 Malignant neoplasm of prostate: Secondary | ICD-10-CM | POA: Insufficient documentation

## 2021-11-27 DIAGNOSIS — Z87891 Personal history of nicotine dependence: Secondary | ICD-10-CM | POA: Diagnosis not present

## 2021-11-27 DIAGNOSIS — L603 Nail dystrophy: Secondary | ICD-10-CM | POA: Insufficient documentation

## 2021-11-27 DIAGNOSIS — E119 Type 2 diabetes mellitus without complications: Secondary | ICD-10-CM | POA: Insufficient documentation

## 2021-11-27 DIAGNOSIS — Z8673 Personal history of transient ischemic attack (TIA), and cerebral infarction without residual deficits: Secondary | ICD-10-CM | POA: Insufficient documentation

## 2021-11-27 DIAGNOSIS — Z9889 Other specified postprocedural states: Secondary | ICD-10-CM | POA: Insufficient documentation

## 2021-11-27 DIAGNOSIS — Z7952 Long term (current) use of systemic steroids: Secondary | ICD-10-CM | POA: Diagnosis not present

## 2021-11-27 DIAGNOSIS — Z79899 Other long term (current) drug therapy: Secondary | ICD-10-CM | POA: Diagnosis not present

## 2021-11-27 DIAGNOSIS — E785 Hyperlipidemia, unspecified: Secondary | ICD-10-CM | POA: Diagnosis not present

## 2021-11-27 DIAGNOSIS — B351 Tinea unguium: Secondary | ICD-10-CM | POA: Insufficient documentation

## 2021-11-27 NOTE — Progress Notes (Signed)
?Radiation Oncology         (336) [phone] ?________________________________ ? ?Initial Outpatient Consultation ? ?Name: Sean Myers MRN: [id number]  ?Date: 11/27/2021  DOB: Oct 25, 1945 ? ?UG:[id number], Tommi Rumps, NP  Tish Men, MD ? ?REFERRING PHYSICIAN: Tish Men, MD ? ?DIAGNOSIS: 76 y.o. gentleman with oligometastatic prostate cancer involving the pelvic lymph nodes and T10 ? ?  ICD-10-CM   ?1. Malignant neoplasm of prostate ([place])  C61   ?  ? ? ?HISTORY OF PRESENT ILLNESS: Sean Myers is a 76 y.o. male with a history of CVA x2 in 2008 resulting in significant disability and dementia. He was initially referred to urology at the [facility] for an elevated PSA of 78.5 in August 2022.  A prostate biopsy was performed on 05/16/21. Out of 12 core biopsies, 3 were positive.  The maximum Gleason score was 4+4, and this was seen in 1 of 6 left-sided cores. Additionally, Gleason 4+3 was seen in 2 of 6 right-sided cores. ? ?A PSMA PET scan was performed on 07/30/21 for disease staging and showed uptake in the prostate as well as presacral and pelvic sidewall nodes.additionally, there was a tiny focus of uptake in the T10 vertebral body as well as nonspecific foci near the left thyroid and a nonspecific left 10th rib fracture. He was subsequently started on degarelix and abiraterone/prednisone in December 2022. His PSA responded well, having decreased to 8.4 on his most recent labs from 08/2021. ? ?The patient reviewed the biopsy results with his urologist and met with Dr. Ali Lowe in radiation oncology at the [facility] in January 2023 to discuss potential radiation treatment options but prefers to have the daily radiation closer to home here in [place] and therefore has kindly been referred today for further discussion of potential radiation treatment options locally. ? ? ?PREVIOUS RADIATION THERAPY: Yes? 1960's but unclear as to what was treated and/or why. ? ?PAST MEDICAL  HISTORY:  ?Past Medical History:  ?Diagnosis Date  ? Depression   ? Diabetes mellitus, type II ([place])   ? ED (erectile dysfunction)   ? History of cerebrovascular accident   ? Hyperlipidemia   ? Stroke [facility])   ?   ? ?PAST SURGICAL HISTORY: ?Past Surgical History:  ?Procedure Laterality Date  ? COLONOSCOPY  1990's  ? [facility]  ? TEE WITHOUT CARDIOVERSION N/A 01/01/2013  ? Procedure: TRANSESOPHAGEAL ECHOCARDIOGRAM (TEE);  Surgeon: Larey Dresser, MD;  Location: [facility];  Service: Cardiovascular;  Laterality: N/A;  ? ? ?FAMILY HISTORY:  ?Family History  ?Problem Relation Age of Onset  ? Heart disease Mother   ? Other Other   ?     no family history of colon cancer  ? ? ?SOCIAL HISTORY:  ?Social History  ? ?Socioeconomic History  ? Marital status: Married  ?  Spouse name: Not on file  ? Number of children: Not on file  ? Years of education: Not on file  ? Highest education level: Not on file  ?Occupational History  ? Not on file  ?Tobacco Use  ? Smoking status: Former  ?  Packs/day: 1.00  ?  Types: Cigarettes  ?  Quit date: 02/15/2013  ?  Years since quitting: 8.7  ? Smokeless tobacco: Never  ?Substance and Sexual Activity  ? Alcohol use: No  ? Drug use: Yes  ?  Types: Marijuana  ?  Comment: marijuana  ? Sexual activity: Yes  ?  Partners: Female  ?Other Topics Concern  ? Not  on file  ?Social History Narrative  ? Retired  ? Married  ? Current Smoker   ? Alcohol use-no       ?  son - [name] living in [place] ([place]) -  engaged to be married  ? ?Social Determinants of Health  ? ?Financial Resource Strain: Not on file  ?Food Insecurity: Not on file  ?Transportation Needs: Not on file  ?Physical Activity: Not on file  ?Stress: Not on file  ?Social Connections: Not on file  ?Intimate Partner Violence: Not on file  ? ? ?ALLERGIES: Poliovirus vaccine inactivated, Aspirin, and Penicillins ? ?MEDICATIONS:  ?Current Outpatient Medications  ?Medication Sig Dispense Refill  ? Calcium Carb-Cholecalciferol 600-10 MG-MCG  TABS TAKE 1 TABLET BY MOUTH DAILY FOR BONE HEALTH    ? losartan (COZAAR) 50 MG tablet Take 1 tablet (50 mg total) by mouth daily. 90 tablet 3  ? potassium chloride SA (KLOR-CON M) 20 MEQ tablet TAKE TWO TABLETS BY MOUTH DAILY (TAKE WITH FOOD)    ? Wound Dressings (AMERIGEL WOUND DRESSING) GEL APPLY SMALL AMOUNT TO AFFECTED AREA DAILY TO LEFT GREAT TOENAIL BED AFTER SOAKING IN EPSOM SALTS AND WATER FOR 15  MIN. THEN APPLY COVADERM DRESSING TO LEFT GREAT TOENAIL BED AFTER SOAKING IN EPSOM SALTS AND WATER FOR 15   MIN. THEN APPLY COVADERM DRESSING    ? abiraterone acetate (ZYTIGA) 250 MG tablet TAKE FOUR TABLETS BY MOUTH DAILY ON AN EMPTY STOMACH (Patient not taking: Reported on 11/27/2021)    ? alfuzosin (UROXATRAL) 10 MG 24 hr tablet take 1 tablet by mouth once daily WITH BREAKFAST (Patient not taking: Reported on 11/27/2021) 90 tablet 3  ? amLODipine (NORVASC) 10 MG tablet Take 1 tablet (10 mg total) by mouth daily. 90 tablet 3  ? clopidogrel (PLAVIX) 75 MG tablet Take 1 tablet (75 mg total) by mouth daily. (Patient not taking: Reported on 11/23/2020) 90 tablet 3  ? doxycycline (VIBRAMYCIN) 100 MG capsule Take 1 capsule (100 mg total) by mouth 2 (two) times daily. 20 capsule 0  ? memantine (NAMENDA TITRATION PAK) tablet pack 5 mg/day for =1 week; 5 mg twice daily for =1 week; 15 mg/day given in 5 mg and 10 mg separated doses for =1 week; then 10 mg twice daily (Patient not taking: Reported on 11/23/2020) 49 tablet 0  ? memantine (NAMENDA) 10 MG tablet Take 1 tablet (10 mg total) by mouth 2 (two) times daily. 180 tablet 3  ? predniSONE (DELTASONE) 10 MG tablet 40 mg x 3 days, 20 mg x 3 days, 10 mg x 3 days (Patient not taking: Reported on 11/27/2021) 21 tablet 0  ? predniSONE (DELTASONE) 5 MG tablet TAKE ONE TABLET BY MOUTH DAILY WITH ABIRATERONE (Patient not taking: Reported on 11/27/2021)    ? simvastatin (ZOCOR) 20 MG tablet TAKE 1 TABLET BY MOUTH DAILY AT 6PM (Patient not taking: Reported on 11/27/2021) 90 tablet 3  ?  simvastatin (ZOCOR) 20 MG tablet Take 1 tablet by mouth at bedtime. (Patient not taking: Reported on 11/27/2021)    ? venlafaxine XR (EFFEXOR-XR) 37.5 MG 24 hr capsule Take one tablet twice a day (Patient not taking: Reported on 11/27/2021) 90 capsule 1  ? ?No current facility-administered medications for this encounter.  ? ? ?REVIEW OF SYSTEMS:  On review of systems, the patient reports that he is doing well overall. He denies any chest pain, shortness of breath, cough, fevers, chills, night sweats. He reports weight loss of 2.5 lbs. He denies any bowel disturbances, and  denies abdominal pain, nausea or vomiting. He denies any new musculoskeletal or joint aches or pains. His IPSS was 7, indicating mild urinary symptoms. His SHIM was 23, indicating he does not have erectile dysfunction. A complete review of systems is obtained and is otherwise negative. ? ?  ?PHYSICAL EXAM:  ?Wt Readings from Last 3 Encounters:  ?11/27/21 154 lb 6 oz (70 kg)  ?10/21/19 191 lb (86.6 kg)  ?04/16/18 189 lb 8 oz (86 kg)  ? ?Temp Readings from Last 3 Encounters:  ?11/27/21 (!) 96.8 ?F (36 ?C) (Temporal)  ?10/21/19 97.6 ?F (36.4 ?C)  ?04/16/18 98.2 ?F (36.8 ?C) (Oral)  ? ?BP Readings from Last 3 Encounters:  ?11/27/21 (!) 141/87  ?10/21/19 (!) 160/104  ?04/16/18 128/70  ? ?Pulse Readings from Last 3 Encounters:  ?11/27/21 89  ?04/16/18 64  ?09/11/17 64  ? ?Pain Assessment ?Pain Score: 0-No pain/10 ? ?In general this is a well appearing African-American male in no acute distress. He's alert and oriented x4 and appropriate throughout the examination. Cardiopulmonary assessment is negative for acute distress, and he exhibits normal effort.   ? ? ?KPS = 50 (associated with chronic generalized weakness and dementia secondary to prior CVAs) ? ?100 - Normal; no complaints; no evidence of disease. ?90   - Able to carry on normal activity; minor signs or symptoms of disease. ?80   - Normal activity with effort; some signs or symptoms of disease. ?105    - Cares for self; unable to carry on normal activity or to do active work. ?60   - Requires occasional assistance, but is able to care for most of his personal needs. ?50   - Requires considerable assistance and

## 2021-12-06 NOTE — Progress Notes (Signed)
?  Radiation Oncology         [phone]) [phone] ?________________________________ ? ?Name: Sean Myers MRN: [id number]  ?Date: 12/07/2021  DOB: 1945-12-25 ? ?STEREOTACTIC BODY RADIOTHERAPY ?SIMULATION AND TREATMENT PLANNING NOTE ? ?  ICD-10-CM   ?1. Prostate cancer metastatic to bone [facility])  C61   ? C79.51   ?  ? ? ?DIAGNOSIS:  76 y.o. gentleman with oligometastatic prostate cancer involving the pelvic lymph nodes and T10 ? ?NARRATIVE:  The patient was brought to the [facility].  Identity was confirmed.  All relevant records and images related to the planned course of therapy were reviewed.  The patient freely provided informed written consent to proceed with treatment after reviewing the details related to the planned course of therapy. The consent form was witnessed and verified by the simulation staff.  Then, the patient was set-up in a stable reproducible  supine position for radiation therapy.  A BodyFix immobilization pillow was fabricated for reproducible positioning.  Then I personally applied the abdominal compression paddle to limit respiratory excursion.  4D respiratory motion management CT images were obtained.  Surface markings were placed.  The CT images were loaded into the planning software.  Then, using Cine, MIP, and standard views, the internal target volume (ITV) and planning target volumes (PTV) were delinieated, and avoidance structures were contoured.  Treatment planning then occurred.  The radiation prescription was entered and confirmed.  A total of two complex treatment devices were fabricated in the form of the BodyFix immobilization pillow and a neck accuform cushion.  I have requested : 3D Simulation  I have requested a DVH of the following structures: Heart, Lungs, Esophagus, Chest Wall, Brachial Plexus, Major Blood Vessels, and targets. ? ?SPECIAL TREATMENT PROCEDURE:  The planned course of therapy using radiation constitutes a special treatment procedure. Special care  is required in the management of this patient for the following reasons. This treatment constitutes a Special Treatment Procedure for the following reason: [ High dose per fraction requiring special monitoring for increased toxicities of treatment including daily imaging..  The special nature of the planned course of radiotherapy will require increased physician supervision and oversight to ensure patient's safety with optimal treatment outcomes.  This requires extended time and effort.   ? ?PLAN:  The patient will receive 50 Gy in 5 fractions. ? ?________________________________ ? ?Sheral Apley Tammi Klippel, M.D.  ?

## 2021-12-06 NOTE — Progress Notes (Signed)
?  Radiation Oncology         (336) [phone] ?________________________________ ? ?Name: KYHEEM BATHGATE MRN: [id number]  ?Date: 12/07/2021  DOB: 1946-08-24 ? ?SIMULATION AND TREATMENT PLANNING NOTE ? ?  ICD-10-CM   ?1. Malignant neoplasm of prostate ([place])  C61   ?  ? ? ?DIAGNOSIS:   76 y.o. gentleman with oligometastatic prostate cancer involving the pelvic lymph nodes and T10 ? ?NARRATIVE:  The patient was brought to the [facility].  Identity was confirmed.  All relevant records and images related to the planned course of therapy were reviewed.  The patient freely provided informed written consent to proceed with treatment after reviewing the details related to the planned course of therapy. The consent form was witnessed and verified by the simulation staff.  Then, the patient was set-up in a stable reproducible supine position for radiation therapy.  A vacuum lock pillow device was custom fabricated to position his legs in a reproducible immobilized position.  Then, I performed a urethrogram under sterile conditions to identify the prostatic apex.  CT images were obtained.  Surface markings were placed.  The CT images were loaded into the planning software.  Then the prostate target and avoidance structures including the rectum, bladder, bowel and hips were contoured.  Treatment planning then occurred.  The radiation prescription was entered and confirmed.  A total of one complex treatment devices was fabricated. I have requested : Intensity Modulated Radiotherapy (IMRT) is medically necessary for this case for the following reason:  Rectal sparing.. ? ?PLAN:  The prostate, seminal vesicles, and pelvic lymph nodes will initially be treated to 45 Gy in 25 fractions of 1.8 Gy followed by a boost to the prostate and PET-positive lymph nodes, to 75 Gy with 15 additional fractions of 2.0 Gy  ? ?________________________________ ? ?Sheral Apley Tammi Klippel, M.D.  ?

## 2021-12-07 ENCOUNTER — Ambulatory Visit
Admission: RE | Admit: 2021-12-07 | Discharge: 2021-12-07 | Disposition: A | Discharge status: Home / Self Care | Payer: Medicare Other | Source: Ambulatory Visit | Attending: Radiation Oncology | Admitting: Radiation Oncology

## 2021-12-07 ENCOUNTER — Other Ambulatory Visit: Payer: Self-pay

## 2021-12-07 DIAGNOSIS — C7951 Secondary malignant neoplasm of bone: Secondary | ICD-10-CM | POA: Diagnosis not present

## 2021-12-07 DIAGNOSIS — C61 Malignant neoplasm of prostate: Secondary | ICD-10-CM | POA: Insufficient documentation

## 2021-12-10 DIAGNOSIS — C61 Malignant neoplasm of prostate: Secondary | ICD-10-CM | POA: Diagnosis not present

## 2021-12-10 DIAGNOSIS — C7951 Secondary malignant neoplasm of bone: Secondary | ICD-10-CM | POA: Diagnosis not present

## 2021-12-11 ENCOUNTER — Telehealth: Payer: Self-pay

## 2021-12-11 NOTE — Telephone Encounter (Signed)
This nurse attempted to call patient three times for virtual AWV. Message left that we will call again to reschedule for another time. ?

## 2021-12-13 ENCOUNTER — Encounter: Payer: Self-pay | Admitting: Radiation Oncology

## 2021-12-13 DIAGNOSIS — C61 Malignant neoplasm of prostate: Secondary | ICD-10-CM | POA: Diagnosis not present

## 2021-12-13 DIAGNOSIS — C7951 Secondary malignant neoplasm of bone: Secondary | ICD-10-CM | POA: Diagnosis not present

## 2021-12-18 ENCOUNTER — Ambulatory Visit: Payer: Medicare Other

## 2021-12-19 ENCOUNTER — Ambulatory Visit: Payer: Medicare Other

## 2021-12-20 ENCOUNTER — Ambulatory Visit: Payer: Medicare Other

## 2021-12-21 ENCOUNTER — Ambulatory Visit: Payer: Medicare Other

## 2021-12-22 ENCOUNTER — Telehealth: Payer: Self-pay | Admitting: Adult Health

## 2021-12-22 NOTE — Telephone Encounter (Signed)
I spoke to patient's wife who informed me that Sean Myers died on the morning of 12/16/2022. He passed in his sleep.  ? ?Condolences given  ?

## 2021-12-24 ENCOUNTER — Ambulatory Visit: Payer: Medicare Other

## 2021-12-24 DEATH — deceased

## 2021-12-25 ENCOUNTER — Ambulatory Visit: Payer: Medicare Other

## 2021-12-25 ENCOUNTER — Ambulatory Visit: Payer: Medicare Other | Admitting: Radiation Oncology

## 2021-12-26 ENCOUNTER — Ambulatory Visit: Payer: Medicare Other | Admitting: Radiation Oncology

## 2021-12-26 ENCOUNTER — Ambulatory Visit: Payer: Medicare Other

## 2021-12-27 ENCOUNTER — Ambulatory Visit: Payer: Medicare Other

## 2021-12-27 ENCOUNTER — Ambulatory Visit: Payer: Medicare Other | Admitting: Radiation Oncology

## 2021-12-28 ENCOUNTER — Ambulatory Visit: Payer: Medicare Other | Admitting: Radiation Oncology

## 2021-12-28 ENCOUNTER — Ambulatory Visit: Payer: Medicare Other

## 2021-12-31 ENCOUNTER — Ambulatory Visit: Payer: Medicare Other | Admitting: Radiation Oncology

## 2021-12-31 ENCOUNTER — Ambulatory Visit: Payer: Medicare Other

## 2022-01-01 ENCOUNTER — Ambulatory Visit: Payer: Medicare Other

## 2022-01-02 ENCOUNTER — Ambulatory Visit: Payer: Medicare Other

## 2022-01-02 ENCOUNTER — Ambulatory Visit: Payer: Medicare Other | Admitting: Radiation Oncology

## 2022-01-03 ENCOUNTER — Ambulatory Visit: Payer: Medicare Other

## 2022-01-04 ENCOUNTER — Ambulatory Visit: Payer: Medicare Other

## 2022-01-04 ENCOUNTER — Ambulatory Visit: Payer: Medicare Other | Admitting: Radiation Oncology

## 2022-01-07 ENCOUNTER — Ambulatory Visit: Payer: Medicare Other

## 2022-01-08 ENCOUNTER — Ambulatory Visit: Payer: Medicare Other

## 2022-01-09 ENCOUNTER — Ambulatory Visit: Payer: Medicare Other

## 2022-01-10 ENCOUNTER — Ambulatory Visit: Payer: Medicare Other

## 2022-01-11 ENCOUNTER — Ambulatory Visit: Payer: Medicare Other

## 2022-01-14 ENCOUNTER — Ambulatory Visit: Payer: Medicare Other

## 2022-01-15 ENCOUNTER — Ambulatory Visit: Payer: Medicare Other

## 2022-01-16 ENCOUNTER — Ambulatory Visit: Payer: Medicare Other

## 2022-01-17 ENCOUNTER — Ambulatory Visit: Payer: Medicare Other

## 2022-01-18 ENCOUNTER — Ambulatory Visit: Payer: Medicare Other

## 2022-01-22 ENCOUNTER — Ambulatory Visit: Payer: Medicare Other

## 2022-01-23 ENCOUNTER — Ambulatory Visit: Payer: Medicare Other

## 2022-01-24 ENCOUNTER — Ambulatory Visit: Payer: Medicare Other

## 2022-01-25 ENCOUNTER — Ambulatory Visit: Payer: Medicare Other

## 2022-01-28 ENCOUNTER — Ambulatory Visit: Payer: Medicare Other

## 2022-01-29 ENCOUNTER — Ambulatory Visit: Payer: Medicare Other

## 2022-01-30 ENCOUNTER — Ambulatory Visit: Payer: Medicare Other

## 2022-01-31 ENCOUNTER — Ambulatory Visit: Payer: Medicare Other

## 2022-02-01 ENCOUNTER — Ambulatory Visit: Payer: Medicare Other

## 2022-02-04 ENCOUNTER — Ambulatory Visit: Payer: Medicare Other

## 2022-02-05 ENCOUNTER — Ambulatory Visit: Payer: Medicare Other

## 2022-02-06 ENCOUNTER — Ambulatory Visit: Payer: Medicare Other

## 2022-02-07 ENCOUNTER — Ambulatory Visit: Payer: Medicare Other

## 2022-02-08 ENCOUNTER — Ambulatory Visit: Payer: Medicare Other

## 2022-02-11 ENCOUNTER — Ambulatory Visit: Payer: Medicare Other

## 2022-02-12 ENCOUNTER — Ambulatory Visit: Payer: Medicare Other

## 2022-02-13 ENCOUNTER — Ambulatory Visit: Payer: Medicare Other

## 2022-02-14 ENCOUNTER — Ambulatory Visit: Payer: Medicare Other

## 2022-02-15 ENCOUNTER — Ambulatory Visit: Payer: Medicare Other

## 2022-02-18 ENCOUNTER — Ambulatory Visit: Payer: Medicare Other

## 2022-04-15 NOTE — Progress Notes (Signed)
  Radiation Oncology         [phone]) [phone] ________________________________  Name: Sean Myers MRN: [id number]  Date: 12/13/2021  DOB: 09-08-1945  End of Treatment Note  Diagnosis:    76 y.o. gentleman with oligometastatic prostate cancer involving the pelvic lymph nodes and T10     Indication for treatment:  Palliation       Radiation treatment dates:   N/A  Narrative: The patient was set up to receive radiotherapy to the lymph nodes and T10, but, his situation deteriorated before he received the planned radiation . ________________________________  Sheral Apley Tammi Klippel, M.D.
